# Patient Record
Sex: Male | Born: 1955 | Race: White | Hispanic: No | Marital: Married | Smoking: Former smoker
Health system: Southern US, Community
[De-identification: ages and names within clinical notes are randomized; demographics above are authoritative.]

## PROBLEM LIST (undated history)

## (undated) DIAGNOSIS — E039 Hypothyroidism, unspecified: Secondary | ICD-10-CM

## (undated) DIAGNOSIS — S62509A Fracture of unspecified phalanx of unspecified thumb, initial encounter for closed fracture: Secondary | ICD-10-CM

## (undated) DIAGNOSIS — I10 Essential (primary) hypertension: Secondary | ICD-10-CM

## (undated) DIAGNOSIS — M1990 Unspecified osteoarthritis, unspecified site: Secondary | ICD-10-CM

## (undated) DIAGNOSIS — K219 Gastro-esophageal reflux disease without esophagitis: Secondary | ICD-10-CM

## (undated) HISTORY — DX: Hypothyroidism, unspecified: E03.9

## (undated) HISTORY — DX: Gastro-esophageal reflux disease without esophagitis: K21.9

## (undated) HISTORY — DX: Essential (primary) hypertension: I10

## (undated) HISTORY — DX: Fracture of unspecified phalanx of unspecified thumb, initial encounter for closed fracture: S62.509A

---

## 2003-01-15 ENCOUNTER — Encounter: Payer: Self-pay | Admitting: Family Medicine

## 2004-10-26 ENCOUNTER — Ambulatory Visit: Payer: Self-pay | Admitting: Family Medicine

## 2004-11-03 ENCOUNTER — Ambulatory Visit: Payer: Self-pay | Admitting: Family Medicine

## 2005-11-19 ENCOUNTER — Ambulatory Visit: Payer: Self-pay | Admitting: Family Medicine

## 2005-11-26 ENCOUNTER — Ambulatory Visit: Payer: Self-pay | Admitting: Family Medicine

## 2007-04-08 DIAGNOSIS — E039 Hypothyroidism, unspecified: Secondary | ICD-10-CM | POA: Insufficient documentation

## 2007-04-08 DIAGNOSIS — J309 Allergic rhinitis, unspecified: Secondary | ICD-10-CM | POA: Insufficient documentation

## 2007-04-08 DIAGNOSIS — K219 Gastro-esophageal reflux disease without esophagitis: Secondary | ICD-10-CM

## 2007-08-18 ENCOUNTER — Telehealth: Payer: Self-pay | Admitting: Family Medicine

## 2007-08-22 ENCOUNTER — Ambulatory Visit: Payer: Self-pay | Admitting: Family Medicine

## 2007-08-22 DIAGNOSIS — I10 Essential (primary) hypertension: Secondary | ICD-10-CM | POA: Insufficient documentation

## 2007-08-26 LAB — CONVERTED CEMR LAB
ALT: 24 units/L (ref 0–53)
Albumin: 4 g/dL (ref 3.5–5.2)
Alkaline Phosphatase: 77 units/L (ref 39–117)
BUN: 11 mg/dL (ref 6–23)
Basophils Absolute: 0 10*3/uL (ref 0.0–0.1)
Basophils Relative: 0.4 % (ref 0.0–1.0)
CO2: 32 meq/L (ref 19–32)
Calcium: 9.9 mg/dL (ref 8.4–10.5)
Eosinophils Absolute: 0.3 10*3/uL (ref 0.0–0.6)
GFR calc Af Amer: 102 mL/min
GFR calc non Af Amer: 84 mL/min
Lymphocytes Relative: 40.4 % (ref 12.0–46.0)
MCHC: 33.8 g/dL (ref 30.0–36.0)
Monocytes Relative: 9.5 % (ref 3.0–11.0)
Neutro Abs: 3.1 10*3/uL (ref 1.4–7.7)
Platelets: 278 10*3/uL (ref 150–400)

## 2007-10-02 ENCOUNTER — Ambulatory Visit: Payer: Self-pay | Admitting: Family Medicine

## 2007-10-15 ENCOUNTER — Telehealth: Payer: Self-pay | Admitting: Family Medicine

## 2007-10-27 ENCOUNTER — Telehealth: Payer: Self-pay | Admitting: Family Medicine

## 2008-07-21 ENCOUNTER — Ambulatory Visit: Payer: Self-pay | Admitting: Family Medicine

## 2008-07-21 DIAGNOSIS — M25569 Pain in unspecified knee: Secondary | ICD-10-CM | POA: Insufficient documentation

## 2008-07-30 ENCOUNTER — Ambulatory Visit: Payer: Self-pay | Admitting: Internal Medicine

## 2008-08-13 ENCOUNTER — Ambulatory Visit: Payer: Self-pay | Admitting: Internal Medicine

## 2008-08-13 HISTORY — PX: COLONOSCOPY: SHX174

## 2008-08-13 LAB — HM COLONOSCOPY

## 2008-09-27 ENCOUNTER — Ambulatory Visit: Payer: Self-pay | Admitting: Family Medicine

## 2008-09-27 DIAGNOSIS — S8410XA Injury of peroneal nerve at lower leg level, unspecified leg, initial encounter: Secondary | ICD-10-CM

## 2008-11-01 ENCOUNTER — Telehealth: Payer: Self-pay | Admitting: Family Medicine

## 2008-11-22 ENCOUNTER — Ambulatory Visit: Payer: Self-pay | Admitting: Family Medicine

## 2008-11-22 LAB — CONVERTED CEMR LAB
Nitrite: NEGATIVE
Urobilinogen, UA: 1

## 2008-11-29 ENCOUNTER — Telehealth: Payer: Self-pay | Admitting: Family Medicine

## 2008-11-30 ENCOUNTER — Ambulatory Visit: Payer: Self-pay | Admitting: Family Medicine

## 2008-11-30 LAB — CONVERTED CEMR LAB
Basophils Relative: 0.1 % (ref 0.0–3.0)
CO2: 31 meq/L (ref 19–32)
Calcium: 9.4 mg/dL (ref 8.4–10.5)
Chloride: 107 meq/L (ref 96–112)
Creatinine, Ser: 1.1 mg/dL (ref 0.4–1.5)
Direct LDL: 155.4 mg/dL
Eosinophils Absolute: 0.2 10*3/uL (ref 0.0–0.7)
Eosinophils Relative: 4.5 % (ref 0.0–5.0)
Hemoglobin: 16 g/dL (ref 13.0–17.0)
Lymphocytes Relative: 33.7 % (ref 12.0–46.0)
MCHC: 34.5 g/dL (ref 30.0–36.0)
MCV: 97.9 fL (ref 78.0–100.0)
Neutro Abs: 2.5 10*3/uL (ref 1.4–7.7)
Neutrophils Relative %: 50.6 % (ref 43.0–77.0)
PSA: 0.34 ng/mL (ref 0.10–4.00)
RBC: 4.74 M/uL (ref 4.22–5.81)
Sodium: 143 meq/L (ref 135–145)
TSH: 2.81 microintl units/mL (ref 0.35–5.50)
Total Bilirubin: 1.1 mg/dL (ref 0.3–1.2)
Total CHOL/HDL Ratio: 4
VLDL: 17 mg/dL (ref 0.0–40.0)
WBC: 5 10*3/uL (ref 4.5–10.5)

## 2009-08-18 ENCOUNTER — Ambulatory Visit: Payer: Self-pay | Admitting: Family Medicine

## 2009-08-18 DIAGNOSIS — M67919 Unspecified disorder of synovium and tendon, unspecified shoulder: Secondary | ICD-10-CM | POA: Insufficient documentation

## 2009-08-18 DIAGNOSIS — M719 Bursopathy, unspecified: Secondary | ICD-10-CM

## 2009-10-27 ENCOUNTER — Telehealth: Payer: Self-pay | Admitting: Family Medicine

## 2010-08-13 LAB — CONVERTED CEMR LAB
Cholesterol: 235 mg/dL (ref 0–200)
HDL: 51.7 mg/dL (ref >39.0)
PSA: 0.32 ng/mL (ref 0.10–4.00)
Total CHOL/HDL Ratio: 4.5
Triglycerides: 90 mg/dL (ref 0–149)
VLDL: 18 mg/dL (ref 0–40)

## 2010-08-15 NOTE — Assessment & Plan Note (Signed)
Summary: left shoulder pain x 2 weeks//ccm   Vital Signs:  Patient profile:   55 year old male Weight:      242 pounds BMI:     30.77 Temp:     97.7 degrees F oral Pulse rate:   86 / minute BP sitting:   114 / 76  (left arm) Cuff size:   large  Vitals Entered By: Alfred Levins, CMA (August 18, 2009 8:55 AM) CC: lt shoulder pain x2 wks   History of Present Illness: Here for 3 weeks of mild sharp pains in the anterior left shoulder. No trauma, but he has been doing a lot around the house lately like cleaning gutters and climbing ladders. No pain down the arm.  Current Medications (verified): 1)  Levoxyl 25 Mcg  Tabs (Levothyroxine Sodium) .... Once Daily 2)  Atacand Hct 32-12.5 Mg  Tabs (Candesartan Cilexetil-Hctz) .... Once Daily 3)  Omeprazole 10 Mg  Cpdr (Omeprazole) .... One By Mouth Bid  Allergies (verified): No Known Drug Allergies  Past History:  Past Medical History: Reviewed history from 10/02/2007 and no changes required. Allergic rhinitis GERD Hypothyroidism Hypertension hematuria, benign (had negative workup per Dr. Ernestine Conrad in [facility]) thumb fracture  Past Surgical History: Reviewed history from 11/30/2008 and no changes required. colonoscopy 08-13-08 per Dr. Juanda Chance, repeat 10 yrs Denies surgical history  Review of Systems  The patient denies anorexia, fever, weight loss, weight gain, vision loss, decreased hearing, hoarseness, chest pain, syncope, dyspnea on exertion, peripheral edema, prolonged cough, headaches, hemoptysis, abdominal pain, melena, hematochezia, severe indigestion/heartburn, hematuria, incontinence, genital sores, muscle weakness, suspicious skin lesions, transient blindness, difficulty walking, depression, unusual weight change, abnormal bleeding, enlarged lymph nodes, angioedema, breast masses, and testicular masses.    Physical Exam  General:  Well-developed,well-nourished,in no acute distress; alert,appropriate and cooperative  throughout examination Extremities:  tender in the anterior left shoulder in the subacromial area. Full ROM actively and passively. No crepitus.   Impression & Recommendations:  Problem # 1:  SUBACROMIAL BURSITIS (ICD-726.10)  Complete Medication List: 1)  Levoxyl 25 Mcg Tabs (Levothyroxine sodium) .... Once daily 2)  Atacand Hct 32-12.5 Mg Tabs (Candesartan cilexetil-hctz) .... Once daily 3)  Omeprazole 10 Mg Cpdr (Omeprazole) .... One by mouth bid  Patient Instructions: 1)  Rest, heat, and take 2 Aleeves two times a day for a week or so.  2)  Please schedule a follow-up appointment as needed .

## 2010-08-15 NOTE — Progress Notes (Signed)
Summary: Rx's for mail order  Phone Note Refill Request   Refills Requested: Medication #1:  LEVOXYL 25 MCG  TABS once daily   Dosage confirmed as above?Dosage Confirmed   Supply Requested: 3 months   Notes: #90  Medication #2:  OMEPRAZOLE 10 MG  CPDR one by mouth bid.   Dosage confirmed as above?Dosage Confirmed   Supply Requested: 3 months   Notes: #180  Method Requested: Fax to Local Pharmacy Initial call taken by: Raechel Ache, RN,  October 27, 2009 8:48 AM Caller: [facility] Call For: Dr Clent Ridges  Summary of Call: Need new Rx's  faxed to [facility] @ [phone].  Follow-up for Phone Call        done Follow-up by: Nelwyn Salisbury MD,  October 27, 2009 9:11 AM  Additional Follow-up for Phone Call Additional follow up Details #1::        Rx faxed to pharmacy Additional Follow-up by: Raechel Ache, RN,  October 27, 2009 9:50 AM    New/Updated Medications: OMEPRAZOLE 10 MG  CPDR (OMEPRAZOLE) one by mouth bid Prescriptions: OMEPRAZOLE 10 MG  CPDR (OMEPRAZOLE) one by mouth bid  #180 x 3   Entered and Authorized by:   Nelwyn Salisbury MD   Signed by:   Nelwyn Salisbury MD on 10/27/2009   Method used:   Print then Give to Patient   RxID:   [id number] LEVOXYL 25 MCG  TABS (LEVOTHYROXINE SODIUM) once daily  #90 x 3   Entered and Authorized by:   Nelwyn Salisbury MD   Signed by:   Nelwyn Salisbury MD on 10/27/2009   Method used:   Print then Give to Patient   RxID:   [id number]

## 2010-10-24 ENCOUNTER — Other Ambulatory Visit: Payer: Self-pay | Admitting: *Deleted

## 2010-10-24 MED ORDER — LEVOTHYROXINE SODIUM 25 MCG PO TABS: 25.0000 ug | ORAL_TABLET | Freq: Every day | ORAL | Status: DC

## 2010-10-24 MED ORDER — OMEPRAZOLE 10 MG PO CPDR: 10.0000 mg | DELAYED_RELEASE_CAPSULE | Freq: Two times a day (BID) | ORAL | Status: DC

## 2010-10-24 NOTE — Telephone Encounter (Signed)
rx sent to [facility]

## 2010-10-24 NOTE — Telephone Encounter (Signed)
[facility],  Levothyroxine and Omeprazole refills per pt request.

## 2010-11-22 ENCOUNTER — Telehealth: Payer: Self-pay | Admitting: *Deleted

## 2010-11-22 NOTE — Telephone Encounter (Signed)
Pt left message on voice mail that he is having leg pain.  Called him back and left message to call me back.

## 2010-11-22 NOTE — Telephone Encounter (Signed)
Appt scheduled for pt to see Dr. Clent Ridges with leg pain.

## 2010-11-23 ENCOUNTER — Encounter: Payer: Self-pay | Admitting: Family Medicine

## 2010-11-24 ENCOUNTER — Ambulatory Visit (INDEPENDENT_AMBULATORY_CARE_PROVIDER_SITE_OTHER): Payer: BC Managed Care – PPO | Admitting: Family Medicine

## 2010-11-24 ENCOUNTER — Encounter: Payer: Self-pay | Admitting: Family Medicine

## 2010-11-24 VITALS — BP 118/62 | HR 77 | Temp 98.1°F | Wt 249.0 lb

## 2010-11-24 DIAGNOSIS — IMO0002 Reserved for concepts with insufficient information to code with codable children: Secondary | ICD-10-CM

## 2010-11-24 DIAGNOSIS — S76019A Strain of muscle, fascia and tendon of unspecified hip, initial encounter: Secondary | ICD-10-CM

## 2010-11-24 NOTE — Progress Notes (Signed)
  Subjective:    Patient ID: Timothy Wise, male    DOB: February 16, 1956, 55 y.o.   MRN: [id number]  HPI Here for 6 days of a sharp pain in the left groin which is worst when he first stands up from a sitting position or gets out of bed. Then after he walks around a few minutes this loosens up and the pain improves. He thinks this may have come from a fall he took last week while out walking his dog, when he tripped over something on the ground.    Review of Systems  Constitutional: Negative.   Musculoskeletal: Positive for arthralgias.       Objective:   Physical Exam  Constitutional:       limping  Musculoskeletal:       Tender along the insertion of the right hip adductors with no swelling and full ROM           Assessment & Plan:  Rest, ice, Aleve prn. I advised him that this may take 2-3 months to heal. Recheck prn

## 2010-12-01 ENCOUNTER — Other Ambulatory Visit (INDEPENDENT_AMBULATORY_CARE_PROVIDER_SITE_OTHER): Payer: BC Managed Care – PPO | Admitting: Family Medicine

## 2010-12-01 DIAGNOSIS — Z0000 Encounter for general adult medical examination without abnormal findings: Secondary | ICD-10-CM

## 2010-12-01 DIAGNOSIS — Z13220 Encounter for screening for lipoid disorders: Secondary | ICD-10-CM

## 2010-12-01 LAB — POCT URINALYSIS DIPSTICK
Bilirubin, UA: NEGATIVE
Glucose, UA: NEGATIVE
Leukocytes, UA: NEGATIVE
Nitrite, UA: NEGATIVE

## 2010-12-01 LAB — CBC WITH DIFFERENTIAL/PLATELET
Basophils Relative: 0.5 % (ref 0.0–3.0)
Eosinophils Relative: 4.6 % (ref 0.0–5.0)
HCT: 45 % (ref 39.0–52.0)
Hemoglobin: 15.6 g/dL (ref 13.0–17.0)
Lymphs Abs: 2.1 10*3/uL (ref 0.7–4.0)
Monocytes Relative: 7.9 % (ref 3.0–12.0)
Platelets: 285 10*3/uL (ref 150.0–400.0)
RBC: 4.64 Mil/uL (ref 4.22–5.81)
WBC: 6.2 10*3/uL (ref 4.5–10.5)

## 2010-12-01 LAB — BASIC METABOLIC PANEL
BUN: 16 mg/dL (ref 6–23)
GFR: 87.98 mL/min (ref >60.00)
Potassium: 4.5 mEq/L (ref 3.5–5.1)
Sodium: 139 mEq/L (ref 135–145)

## 2010-12-01 LAB — LIPID PANEL
Cholesterol: 214 mg/dL — ABNORMAL HIGH (ref 0–200)
Total CHOL/HDL Ratio: 4

## 2010-12-01 LAB — HEPATIC FUNCTION PANEL
ALT: 19 U/L (ref 0–53)
AST: 20 U/L (ref 0–37)
Total Bilirubin: 0.5 mg/dL (ref 0.3–1.2)

## 2010-12-01 LAB — LDL CHOLESTEROL, DIRECT: Direct LDL: 154.7 mg/dL

## 2010-12-08 ENCOUNTER — Ambulatory Visit (INDEPENDENT_AMBULATORY_CARE_PROVIDER_SITE_OTHER): Payer: BC Managed Care – PPO | Admitting: Family Medicine

## 2010-12-08 ENCOUNTER — Encounter: Payer: Self-pay | Admitting: Family Medicine

## 2010-12-08 VITALS — BP 142/88 | HR 77 | Temp 97.8°F | Resp 16 | Ht 74.25 in | Wt 245.0 lb

## 2010-12-08 DIAGNOSIS — Z136 Encounter for screening for cardiovascular disorders: Secondary | ICD-10-CM

## 2010-12-08 DIAGNOSIS — Z0000 Encounter for general adult medical examination without abnormal findings: Secondary | ICD-10-CM

## 2010-12-08 MED ORDER — LEVOTHYROXINE SODIUM 25 MCG PO TABS: 25.0000 ug | ORAL_TABLET | Freq: Every day | ORAL | Status: DC

## 2010-12-08 MED ORDER — LOSARTAN POTASSIUM-HCTZ 100-12.5 MG PO TABS: 1.0000 | ORAL_TABLET | Freq: Every day | ORAL | Status: DC

## 2010-12-08 NOTE — Progress Notes (Signed)
  Subjective:    Patient ID: Timothy Wise, male    DOB: [birth year], 55 y.o.   MRN: [id number]  HPI 55 yr old male for a cpx. He feels fine and has no complaints.    Review of Systems  Constitutional: Negative.   HENT: Negative.   Eyes: Negative.   Respiratory: Negative.   Cardiovascular: Negative.   Gastrointestinal: Negative.   Genitourinary: Negative.   Musculoskeletal: Negative.   Skin: Negative.   Neurological: Negative.   Hematological: Negative.   Psychiatric/Behavioral: Negative.        Objective:   Physical Exam  Constitutional: He is oriented to person, place, and time. He appears well-developed and well-nourished. No distress.  HENT:  Head: Normocephalic and atraumatic.  Right Ear: External ear normal.  Left Ear: External ear normal.  Nose: Nose normal.  Mouth/Throat: Oropharynx is clear and moist. No oropharyngeal exudate.  Eyes: Conjunctivae and EOM are normal. Pupils are equal, round, and reactive to light. Right eye exhibits no discharge. Left eye exhibits no discharge. No scleral icterus.  Neck: Neck supple. No JVD present. No tracheal deviation present. No thyromegaly present.  Cardiovascular: Normal rate, regular rhythm, normal heart sounds and intact distal pulses.  Exam reveals no gallop and no friction rub.   No murmur heard.      EKG normal   Pulmonary/Chest: Effort normal and breath sounds normal. No respiratory distress. He has no wheezes. He has no rales. He exhibits no tenderness.  Abdominal: Soft. Bowel sounds are normal. He exhibits no distension and no mass. There is no tenderness. There is no rebound and no guarding.  Genitourinary: Rectum normal, prostate normal and penis normal. Guaiac negative stool. No penile tenderness.  Musculoskeletal: Normal range of motion. He exhibits no edema and no tenderness.  Lymphadenopathy:    He has no cervical adenopathy.  Neurological: He is alert and oriented to person, place, and time. He has normal reflexes.  No cranial nerve deficit. He exhibits normal muscle tone. Coordination normal.  Skin: Skin is warm and dry. No rash noted. He is not diaphoretic. No erythema. No pallor.  Psychiatric: He has a normal mood and affect. His behavior is normal. Judgment and thought content normal.          Assessment & Plan:  We discussed exercise and diet.

## 2011-03-13 ENCOUNTER — Telehealth: Payer: Self-pay | Admitting: *Deleted

## 2011-03-13 NOTE — Telephone Encounter (Signed)
Change this to Omeprazole 40 mg a day. Call in #90 with 3 rf

## 2011-03-13 NOTE — Telephone Encounter (Signed)
Pt needs new prescription of Omeprazole 10 mg. One po bid sent to [facility] Mail order.  The 10 mg. Did not control her GERD.

## 2011-03-14 MED ORDER — OMEPRAZOLE 40 MG PO CPDR: 40.0000 mg | DELAYED_RELEASE_CAPSULE | Freq: Every day | ORAL | Status: DC

## 2011-03-14 NOTE — Telephone Encounter (Signed)
Script sent e-scribe 

## 2011-04-10 ENCOUNTER — Other Ambulatory Visit: Payer: Self-pay | Admitting: *Deleted

## 2011-04-10 NOTE — Telephone Encounter (Signed)
Pt needs Omeprazole 10 mg. #180 bid sent to [facility]  Pt would like to be called when this has been done, pleae.

## 2011-04-12 NOTE — Telephone Encounter (Signed)
Please take care of this.  

## 2011-04-13 NOTE — Telephone Encounter (Signed)
Left voice message for pt to return my call. I need to clarify this request, see snap shot of last time ordered.

## 2011-04-16 MED ORDER — OMEPRAZOLE 10 MG PO CPDR: 10.0000 mg | DELAYED_RELEASE_CAPSULE | Freq: Two times a day (BID) | ORAL | Status: DC

## 2011-04-16 NOTE — Telephone Encounter (Signed)
Script sent e-scribe and pt aware. Pt said that the script was changed per Dr. Clent Ridges.

## 2011-04-20 ENCOUNTER — Encounter: Payer: Self-pay | Admitting: Family Medicine

## 2011-04-20 ENCOUNTER — Ambulatory Visit (INDEPENDENT_AMBULATORY_CARE_PROVIDER_SITE_OTHER): Payer: BC Managed Care – PPO | Admitting: Family Medicine

## 2011-04-20 VITALS — BP 138/84 | HR 81 | Temp 97.8°F | Wt 251.0 lb

## 2011-04-20 DIAGNOSIS — M25559 Pain in unspecified hip: Secondary | ICD-10-CM

## 2011-04-20 NOTE — Progress Notes (Signed)
  Subjective:    Patient ID: Timothy Wise, male    DOB: 15-Aug-1955, 55 y.o.   MRN: [id number]  HPI Here for several days of sharp pain in the right hip. He was seen here last May for what seemed to be a simple groin strain, and indeed this resolved after a month or so. 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 The anterior right hip now has sharp pains again which are worst when he puts weight on the right leg. Using Aleve.    Review of Systems  Constitutional: Negative.   Musculoskeletal: Positive for arthralgias and gait problem.       Objective:   Physical Exam  Constitutional:       He is in pain when walking and has a slight limp  Musculoskeletal:       The right groin and hip are not tender. ROM is full. No swelling or masses          Assessment & Plan:  Right hip pain. Refer to Orthopedics to evaluate

## 2011-05-07 ENCOUNTER — Telehealth: Payer: Self-pay | Admitting: Family Medicine

## 2011-05-07 MED ORDER — OMEPRAZOLE 40 MG PO CPDR: 40.0000 mg | DELAYED_RELEASE_CAPSULE | Freq: Every day | ORAL | Status: DC

## 2011-05-07 NOTE — Telephone Encounter (Signed)
done

## 2011-06-09 ENCOUNTER — Other Ambulatory Visit: Payer: Self-pay | Admitting: Orthopedic Surgery

## 2011-08-07 ENCOUNTER — Other Ambulatory Visit: Payer: Self-pay | Admitting: Orthopedic Surgery

## 2011-08-07 NOTE — H&P (Signed)
Timothy Wise  DOB: 04/10/1956 Married / Language: English / Race: White / Male  Date of Admission:  08/29/2011  Chief Complaint:  Right Hip Pain  History of Present Illness The patient is a 56 year old male who comes in today for a preoperative History and Physical. The patient is scheduled for a right total hip arthroplasty to be performed by Dr. Frank V. Aluisio, MD at [facility] on 08/29/2011. The patient is a 56 year old male who presents with a hip problem. The patient is here today in referral from Dr. Kendall.The patient reports right hip problems including pain symptoms that have been present for 7 month(s). The symptoms began without any known injury. Symptoms reported include hip pain (groin, unable to cross right leg over left) and pain with weightbearing (unable to cross right leg over left) Previous workup for this problem has included hip x-rays and hip MRI. Previous treatment for this problem has included nonsteroidal anti-inflammatory drugs (Celebrex, helps with the pain). Wilma does not recall any specific injury which led to this hip problem. Pain is mainly in the groin radiating towards his knee. He has had increased limitations in function and motion especially over the past few months. His regular x-rays showed some subchondral sclerosis and Dr. 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 His symptoms have concurrently worsened. It is definitely limiting what he can and can not do. He has not had any history of significant Prednisone use. He does have history of alcohol use. He drinks six to seven beers a night and has been doing this for years. He is ready to proceed with surgery. They have been treated conservatively in the past for the above stated problem and despite conservative measures, they continue to have progressive pain and severe functional limitations and dysfunction. They have failed non-operative  management. It is felt that they would benefit from undergoing total joint replacement. Risks and benefits of the procedure have been discussed with the patient and they elect to proceed with surgery. There are no active contraindications to surgery such as ongoing infection or rapidly progressive neurological disease.  Allergies No Known Drug Allergies  Medication History CeleBREX (200MG Capsule, 1 (one) Oral daily, as needed, Taken starting 08/07/2011) Active. Omeprazole (40MG Capsule DR, Oral daily) Active. Losartan Potassium-HCTZ (100-12.5MG Tablet, Oral daily) Active. Levothyroxine Sodium (25MCG Tablet, Oral daily) Active.  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 Deceased. Age 55 Mother. Deceased, Cerebrovascular Accident, Alzheimer's disease. Ago 70's Brother 1. Living. Ag 51 Sister 1. Living. Age 48  Social History Most recent primary occupation. Warehouse Manager Number of flights of stairs before winded. 2-3 Marital status. married Living situation. live with spouse Illicit drug use. no Pain Contract. no Previously in rehab. no Tobacco / smoke exposure. no Exercise. Exercises rarely; does running / walking Current work status. working full time Alcohol use. current drinker; drinks beer; 15 or more per week Children. 0 Post-Surgical Plans. Plan is to go home.  Review of Systems General:Not Present- Chills, Fever, Night Sweats, Fatigue, Weight Gain, Weight Loss and Memory Loss. Skin:Not Present- Hives, Itching, Rash, Eczema and Lesions. HEENT:Not Present- Tinnitus, Headache, Double Vision, Visual Loss, Hearing Loss and Dentures. Respiratory:Not Present- Shortness of breath with exertion, Shortness of breath at rest,  Allergies, Coughing up   blood and Chronic Cough. Cardiovascular:Not Present- Chest Pain, Racing/skipping heartbeats, Difficulty Breathing Lying Down, Murmur, Swelling and Palpitations. Gastrointestinal:Not Present- Bloody Stool, Heartburn, Abdominal Pain, Vomiting, Nausea, Constipation, Diarrhea, Difficulty Swallowing, Jaundice and Loss of appetitie. Male Genitourinary:Not Present- Urinary frequency, Blood in Urine, Weak urinary stream, Discharge, Flank Pain, Incontinence, Painful Urination, Urgency, Urinary Retention and Urinating at Night. Musculoskeletal:Present- Joint Pain and Morning Stiffness. Not Present- Muscle Weakness, Muscle Pain, Joint Swelling, Back Pain and Spasms. Neurological:Not Present- Tremor, Dizziness, Blackout spells, Paralysis, Difficulty with balance and Weakness. Psychiatric:Not Present- Insomnia.  Vitals Weight: 235 lb Height: 74 in Body Surface Area: 2.36 m Body Mass Index: 30.17 kg/m Pulse: 64 (Regular) Resp.: 14 (Unlabored) BP: 142/84 (Sitting, Right Arm, Standard)  Physical Exam The physical exam findings are as follows: Patient is a 56 year old male with continued hip pain.  General Mental Status - Alert, cooperative and good historian. General Appearance- pleasant. Not in acute distress. Orientation- Oriented X3. Build & Nutrition- Well nourished and Well developed.  Head and Neck Head- normocephalic, atraumatic . Neck Global Assessment- supple. no bruit auscultated on the right and no bruit auscultated on the left.  Eye Pupil- Bilateral- Regular and Round. Note: Wears glasses. Motion- Bilateral- EOMI.  Chest and Lung Exam Auscultation: Breath sounds:- clear at anterior chest wall and - clear at posterior chest wall. Adventitious sounds:- No Adventitious sounds.  Cardiovascular Auscultation:Rhythm- Regular rate and rhythm. Heart Sounds- S1 WNL and S2 WNL. Murmurs & Other Heart Sounds: Murmur 1:Location-  Aortic Area. Timing- Mid-systolic. Grade- II/VI. Character- Low pitched.  Abdomen Palpation/Percussion:Tenderness- Abdomen is non-tender to palpation. Rigidity (guarding)- Abdomen is soft. Auscultation:Auscultation of the abdomen reveals - Bowel sounds normal.  Male Genitourinary Not done, not pertinent to present illness  Musculoskeletal Well developed male, alert and oriented in no apparent distress. Left hip flexion about 110, rotation in 30, out 40, abduction 40 without discomfort. Right hip flexion 95, no internal rotation, about 20 external rotation, 20 to 30 abduction. Pulses, sensation and motor are intact both lower extremities. Knee exam is normal. Antalgic gait pattern is noted.  RADIOGRAPHS: Plain radiographs from earlier in the year and it showed that he did not have any bone on bone change in the hip. We also reviewed the MRI. He has an area of high grade osteonecrosis involving close to half the femoral head with some collapse of that segment. He also had some osteonecrosis on the left but no where near as bad as the right.  Impression/Plan Avascular necrosis of femur head, right   Patient is for a Right Total Hip Replacement by Dr. Aluisio.  Plan is to go home after surgery.  PCP - Dr. Stephen Fry  Drew Perkins, PA-C 

## 2011-08-22 ENCOUNTER — Ambulatory Visit (HOSPITAL_COMMUNITY)
Admission: RE | Admit: 2011-08-22 | Discharge: 2011-08-22 | Disposition: A | Discharge status: Home / Self Care | Payer: BC Managed Care – PPO | Source: Ambulatory Visit | Attending: Orthopedic Surgery | Admitting: Orthopedic Surgery

## 2011-08-22 ENCOUNTER — Encounter (HOSPITAL_COMMUNITY)
Admission: RE | Admit: 2011-08-22 | Discharge: 2011-08-22 | Disposition: A | Discharge status: Home / Self Care | Payer: BC Managed Care – PPO | Source: Ambulatory Visit | Attending: Orthopedic Surgery | Admitting: Orthopedic Surgery

## 2011-08-22 ENCOUNTER — Encounter (HOSPITAL_COMMUNITY): Payer: Self-pay | Admitting: Pharmacy Technician

## 2011-08-22 ENCOUNTER — Encounter (HOSPITAL_COMMUNITY): Payer: Self-pay

## 2011-08-22 DIAGNOSIS — Z01812 Encounter for preprocedural laboratory examination: Secondary | ICD-10-CM | POA: Insufficient documentation

## 2011-08-22 DIAGNOSIS — Z01818 Encounter for other preprocedural examination: Secondary | ICD-10-CM | POA: Insufficient documentation

## 2011-08-22 DIAGNOSIS — M1990 Unspecified osteoarthritis, unspecified site: Secondary | ICD-10-CM

## 2011-08-22 HISTORY — DX: Unspecified osteoarthritis, unspecified site: M19.90

## 2011-08-22 LAB — URINALYSIS, ROUTINE W REFLEX MICROSCOPIC
Bilirubin Urine: NEGATIVE
Glucose, UA: NEGATIVE mg/dL
Hgb urine dipstick: NEGATIVE
Ketones, ur: NEGATIVE mg/dL
Leukocytes, UA: NEGATIVE
Nitrite: NEGATIVE
Protein, ur: NEGATIVE mg/dL
Specific Gravity, Urine: 1.018 (ref 1.005–1.030)
Urobilinogen, UA: 0.2 mg/dL (ref 0.0–1.0)
pH: 7.5 (ref 5.0–8.0)

## 2011-08-22 LAB — COMPREHENSIVE METABOLIC PANEL
ALT: 48 U/L (ref 0–53)
AST: 27 U/L (ref 0–37)
Albumin: 3.7 g/dL (ref 3.5–5.2)
Alkaline Phosphatase: 114 U/L (ref 39–117)
Chloride: 100 mEq/L (ref 96–112)
Potassium: 4.1 mEq/L (ref 3.5–5.1)
Sodium: 135 mEq/L (ref 135–145)
Total Bilirubin: 0.5 mg/dL (ref 0.3–1.2)

## 2011-08-22 LAB — PROTIME-INR
INR: 0.87 (ref 0.00–1.49)
Prothrombin Time: 12 s (ref 11.6–15.2)

## 2011-08-22 LAB — CBC
Platelets: 247 10*3/uL (ref 150–400)
RBC: 4.69 MIL/uL (ref 4.22–5.81)
RDW: 12.3 % (ref 11.5–15.5)
WBC: 6.8 10*3/uL (ref 4.0–10.5)

## 2011-08-22 LAB — APTT: aPTT: 27 seconds (ref 24–37)

## 2011-08-22 LAB — SURGICAL PCR SCREEN: Staphylococcus aureus: POSITIVE — AB

## 2011-08-22 MED ORDER — CHLORHEXIDINE GLUCONATE 4 % EX LIQD: 60.0000 mL | Freq: Once | CUTANEOUS | Status: DC

## 2011-08-22 NOTE — Patient Instructions (Signed)
Timothy Wise  08/22/2011   Your procedure is scheduled on: 08-29-11  Report to [facility] at    10:30    AM.  Call this number if you have problems the morning of surgery: [phone]   Remember:   Do not eat food:After Midnight.  May have clear liquids:up to 6 Hours before arrival. Nothing after : 06:00AM.  Clear liquids include soda, tea, black coffee, apple or grape juice, broth.  Take these medicines the morning of surgery with A SIP OF WATER: Omeprazole, Synthroid   Do not wear jewelry, make-up or nail polish.  Do not wear lotions, powders, or perfumes. You may wear deodorant.  Do not shave 48 hours prior to surgery.(may shave neck and face only)  Do not bring valuables to the hospital.  Contacts, dentures or bridgework may not be worn into surgery.  Leave suitcase in the car. After surgery it may be brought to your room.  For patients admitted to the hospital, checkout time is 11:00 AM the day of discharge.   Patients discharged the day of surgery will not be allowed to drive home.  Name and phone number of your driver: Timothy Wise, spouse [phone]  Special Instructions: CHG Shower Use Special Wash: 1/2 bottle night before surgery and 1/2 bottle morning of surgery.   Please read over the following fact sheets that you were given: MRSA Information, Instruction Incentive Spirometry, Blood transfusion fact sheet.

## 2011-08-22 NOTE — Pre-Procedure Instructions (Signed)
08-22-11 Ekg (5'12), CXR to be done today.

## 2011-08-29 ENCOUNTER — Encounter (HOSPITAL_COMMUNITY): Payer: Self-pay | Admitting: Anesthesiology

## 2011-08-29 ENCOUNTER — Encounter (HOSPITAL_COMMUNITY)
Admission: RE | Disposition: A | Discharge status: Home Health | Payer: Self-pay | Source: Ambulatory Visit | Attending: Orthopedic Surgery

## 2011-08-29 ENCOUNTER — Inpatient Hospital Stay (HOSPITAL_COMMUNITY): Payer: BC Managed Care – PPO

## 2011-08-29 ENCOUNTER — Inpatient Hospital Stay (HOSPITAL_COMMUNITY)
Admission: RE | Admit: 2011-08-29 | Discharge: 2011-08-31 | DRG: 818 | Disposition: A | Discharge status: Home Health | Payer: BC Managed Care – PPO | Source: Ambulatory Visit | Attending: Orthopedic Surgery | Admitting: Orthopedic Surgery

## 2011-08-29 ENCOUNTER — Inpatient Hospital Stay (HOSPITAL_COMMUNITY): Payer: BC Managed Care – PPO | Admitting: Anesthesiology

## 2011-08-29 DIAGNOSIS — E871 Hypo-osmolality and hyponatremia: Secondary | ICD-10-CM | POA: Diagnosis not present

## 2011-08-29 DIAGNOSIS — M8970 Major osseous defect, unspecified site: Secondary | ICD-10-CM | POA: Diagnosis present

## 2011-08-29 DIAGNOSIS — Z96649 Presence of unspecified artificial hip joint: Secondary | ICD-10-CM

## 2011-08-29 DIAGNOSIS — M87059 Idiopathic aseptic necrosis of unspecified femur: Principal | ICD-10-CM | POA: Diagnosis present

## 2011-08-29 DIAGNOSIS — I10 Essential (primary) hypertension: Secondary | ICD-10-CM | POA: Diagnosis present

## 2011-08-29 DIAGNOSIS — E039 Hypothyroidism, unspecified: Secondary | ICD-10-CM | POA: Diagnosis present

## 2011-08-29 HISTORY — PX: TOTAL HIP ARTHROPLASTY: SHX124

## 2011-08-29 LAB — TYPE AND SCREEN: Antibody Screen: NEGATIVE

## 2011-08-29 SURGERY — ARTHROPLASTY, HIP, TOTAL,POSTERIOR APPROACH
Anesthesia: General | Site: Hip | Laterality: Right | Wound class: Clean

## 2011-08-29 MED ORDER — SODIUM CHLORIDE 0.9 % IV SOLN
INTRAVENOUS | Status: DC
  Administered 2011-08-29 – 2011-08-30 (×3): via INTRAVENOUS

## 2011-08-29 MED ORDER — POLYETHYLENE GLYCOL 3350 17 G PO PACK
17.0000 g | PACK | Freq: Every day | ORAL | Status: DC | PRN
  Filled 2011-08-29: qty 1

## 2011-08-29 MED ORDER — 0.9 % SODIUM CHLORIDE (POUR BTL) OPTIME
TOPICAL | Status: DC | PRN
  Administered 2011-08-29: 1000 mL

## 2011-08-29 MED ORDER — LIDOCAINE HCL (CARDIAC) 20 MG/ML IV SOLN
INTRAVENOUS | Status: DC | PRN
  Administered 2011-08-29: 100 mg via INTRAVENOUS

## 2011-08-29 MED ORDER — GLYCOPYRROLATE 0.2 MG/ML IJ SOLN
INTRAMUSCULAR | Status: DC | PRN
  Administered 2011-08-29: .8 mg via INTRAVENOUS

## 2011-08-29 MED ORDER — HYDROMORPHONE HCL PF 1 MG/ML IJ SOLN
INTRAMUSCULAR | Status: DC | PRN
  Administered 2011-08-29 (×2): 1 mg via INTRAVENOUS

## 2011-08-29 MED ORDER — MORPHINE SULFATE 2 MG/ML IJ SOLN
INTRAMUSCULAR | Status: AC
  Administered 2011-08-29: 2 mg via INTRAVENOUS
  Filled 2011-08-29: qty 1

## 2011-08-29 MED ORDER — LACTATED RINGERS IV SOLN
INTRAVENOUS | Status: DC
  Administered 2011-08-29: 1000 mL via INTRAVENOUS

## 2011-08-29 MED ORDER — DOCUSATE SODIUM 100 MG PO CAPS
100.0000 mg | ORAL_CAPSULE | Freq: Two times a day (BID) | ORAL | Status: DC
  Administered 2011-08-29 – 2011-08-31 (×4): 100 mg via ORAL
  Filled 2011-08-29 (×7): qty 1

## 2011-08-29 MED ORDER — HYDROCHLOROTHIAZIDE 12.5 MG PO CAPS
12.5000 mg | ORAL_CAPSULE | Freq: Every day | ORAL | Status: DC
  Administered 2011-08-30 – 2011-08-31 (×2): 12.5 mg via ORAL
  Filled 2011-08-29 (×3): qty 1

## 2011-08-29 MED ORDER — METHOCARBAMOL 500 MG PO TABS
500.0000 mg | ORAL_TABLET | Freq: Four times a day (QID) | ORAL | Status: DC | PRN
  Administered 2011-08-29 – 2011-08-31 (×4): 500 mg via ORAL
  Filled 2011-08-29 (×6): qty 1

## 2011-08-29 MED ORDER — DEXAMETHASONE SODIUM PHOSPHATE 10 MG/ML IJ SOLN: 10.0000 mg | Freq: Once | INTRAMUSCULAR | Status: DC

## 2011-08-29 MED ORDER — LOSARTAN POTASSIUM-HCTZ 100-12.5 MG PO TABS: 1.0000 | ORAL_TABLET | Freq: Every day | ORAL | Status: DC

## 2011-08-29 MED ORDER — FENTANYL CITRATE 0.05 MG/ML IJ SOLN
INTRAMUSCULAR | Status: DC | PRN
  Administered 2011-08-29: 50 ug via INTRAVENOUS
  Administered 2011-08-29 (×2): 100 ug via INTRAVENOUS
  Administered 2011-08-29 (×2): 50 ug via INTRAVENOUS

## 2011-08-29 MED ORDER — ACETAMINOPHEN 650 MG RE SUPP: 650.0000 mg | Freq: Four times a day (QID) | RECTAL | Status: DC | PRN

## 2011-08-29 MED ORDER — ACETAMINOPHEN 10 MG/ML IV SOLN
1000.0000 mg | Freq: Four times a day (QID) | INTRAVENOUS | Status: AC
  Administered 2011-08-29 – 2011-08-30 (×4): 1000 mg via INTRAVENOUS
  Filled 2011-08-29 (×5): qty 100

## 2011-08-29 MED ORDER — NEOSTIGMINE METHYLSULFATE 1 MG/ML IJ SOLN
INTRAMUSCULAR | Status: DC | PRN
  Administered 2011-08-29: 5 mg via INTRAVENOUS

## 2011-08-29 MED ORDER — BISACODYL 10 MG RE SUPP: 10.0000 mg | Freq: Every day | RECTAL | Status: DC | PRN

## 2011-08-29 MED ORDER — OXYCODONE HCL 5 MG PO TABS
5.0000 mg | ORAL_TABLET | ORAL | Status: DC | PRN
  Administered 2011-08-29: 5 mg via ORAL
  Administered 2011-08-30 – 2011-08-31 (×8): 10 mg via ORAL
  Filled 2011-08-29: qty 1
  Filled 2011-08-29 (×4): qty 2
  Filled 2011-08-29: qty 1
  Filled 2011-08-29: qty 2
  Filled 2011-08-29: qty 1
  Filled 2011-08-29 (×5): qty 2

## 2011-08-29 MED ORDER — TEMAZEPAM 15 MG PO CAPS: 15.0000 mg | ORAL_CAPSULE | Freq: Every evening | ORAL | Status: DC | PRN

## 2011-08-29 MED ORDER — CEFAZOLIN SODIUM-DEXTROSE 2-3 GM-% IV SOLR
2.0000 g | Freq: Once | INTRAVENOUS | Status: AC
  Administered 2011-08-29: 2 g via INTRAVENOUS

## 2011-08-29 MED ORDER — ACETAMINOPHEN 10 MG/ML IV SOLN
1000.0000 mg | Freq: Once | INTRAVENOUS | Status: AC
  Administered 2011-08-29: 1000 mg via INTRAVENOUS

## 2011-08-29 MED ORDER — PROMETHAZINE HCL 25 MG/ML IJ SOLN: 6.2500 mg | INTRAMUSCULAR | Status: DC | PRN

## 2011-08-29 MED ORDER — LACTATED RINGERS IV SOLN: INTRAVENOUS | Status: DC

## 2011-08-29 MED ORDER — METHOCARBAMOL 100 MG/ML IJ SOLN
500.0000 mg | Freq: Four times a day (QID) | INTRAVENOUS | Status: DC | PRN
  Administered 2011-08-29: 500 mg via INTRAVENOUS
  Filled 2011-08-29: qty 5

## 2011-08-29 MED ORDER — METOCLOPRAMIDE HCL 10 MG PO TABS: 5.0000 mg | ORAL_TABLET | Freq: Three times a day (TID) | ORAL | Status: DC | PRN

## 2011-08-29 MED ORDER — MENTHOL 3 MG MT LOZG: 1.0000 | LOZENGE | OROMUCOSAL | Status: DC | PRN

## 2011-08-29 MED ORDER — FLEET ENEMA 7-19 GM/118ML RE ENEM: 1.0000 | ENEMA | Freq: Once | RECTAL | Status: AC | PRN

## 2011-08-29 MED ORDER — DIPHENHYDRAMINE HCL 12.5 MG/5ML PO ELIX: 12.5000 mg | ORAL_SOLUTION | ORAL | Status: DC | PRN

## 2011-08-29 MED ORDER — SODIUM CHLORIDE 0.9 % IV SOLN: INTRAVENOUS | Status: DC

## 2011-08-29 MED ORDER — RIVAROXABAN 10 MG PO TABS
10.0000 mg | ORAL_TABLET | Freq: Every day | ORAL | Status: DC
  Administered 2011-08-30 – 2011-08-31 (×2): 10 mg via ORAL
  Filled 2011-08-29 (×3): qty 1

## 2011-08-29 MED ORDER — ROCURONIUM BROMIDE 100 MG/10ML IV SOLN
INTRAVENOUS | Status: DC | PRN
  Administered 2011-08-29: 40 mg via INTRAVENOUS

## 2011-08-29 MED ORDER — MIDAZOLAM HCL 5 MG/5ML IJ SOLN
INTRAMUSCULAR | Status: DC | PRN
  Administered 2011-08-29: 2 mg via INTRAVENOUS

## 2011-08-29 MED ORDER — PHENOL 1.4 % MT LIQD: 1.0000 | OROMUCOSAL | Status: DC | PRN

## 2011-08-29 MED ORDER — CEFAZOLIN SODIUM 1-5 GM-% IV SOLN
1.0000 g | Freq: Four times a day (QID) | INTRAVENOUS | Status: AC
  Administered 2011-08-29 – 2011-08-30 (×3): 1 g via INTRAVENOUS
  Filled 2011-08-29 (×3): qty 50

## 2011-08-29 MED ORDER — PROPOFOL 10 MG/ML IV EMUL
INTRAVENOUS | Status: DC | PRN
  Administered 2011-08-29: 200 mg via INTRAVENOUS

## 2011-08-29 MED ORDER — ONDANSETRON HCL 4 MG/2ML IJ SOLN
INTRAMUSCULAR | Status: DC | PRN
  Administered 2011-08-29: 4 mg via INTRAVENOUS

## 2011-08-29 MED ORDER — ONDANSETRON HCL 4 MG PO TABS: 4.0000 mg | ORAL_TABLET | Freq: Four times a day (QID) | ORAL | Status: DC | PRN

## 2011-08-29 MED ORDER — ONDANSETRON HCL 4 MG/2ML IJ SOLN
4.0000 mg | Freq: Four times a day (QID) | INTRAMUSCULAR | Status: DC | PRN
  Administered 2011-08-29: 4 mg via INTRAVENOUS
  Filled 2011-08-29: qty 2

## 2011-08-29 MED ORDER — PANTOPRAZOLE SODIUM 40 MG PO TBEC
80.0000 mg | DELAYED_RELEASE_TABLET | Freq: Every day | ORAL | Status: DC
  Administered 2011-08-29: 80 mg via ORAL
  Filled 2011-08-29 (×3): qty 2

## 2011-08-29 MED ORDER — ACETAMINOPHEN 325 MG PO TABS: 650.0000 mg | ORAL_TABLET | Freq: Four times a day (QID) | ORAL | Status: DC | PRN

## 2011-08-29 MED ORDER — MORPHINE SULFATE 2 MG/ML IJ SOLN
1.0000 mg | INTRAMUSCULAR | Status: DC | PRN
  Administered 2011-08-29 (×2): 2 mg via INTRAVENOUS
  Filled 2011-08-29: qty 1

## 2011-08-29 MED ORDER — LEVOTHYROXINE SODIUM 25 MCG PO TABS
25.0000 ug | ORAL_TABLET | Freq: Every day | ORAL | Status: DC
  Administered 2011-08-30 – 2011-08-31 (×2): 25 ug via ORAL
  Filled 2011-08-29 (×3): qty 1

## 2011-08-29 MED ORDER — METOCLOPRAMIDE HCL 5 MG/ML IJ SOLN: 5.0000 mg | Freq: Three times a day (TID) | INTRAMUSCULAR | Status: DC | PRN

## 2011-08-29 MED ORDER — LOSARTAN POTASSIUM 50 MG PO TABS
100.0000 mg | ORAL_TABLET | Freq: Every day | ORAL | Status: DC
  Administered 2011-08-30: 100 mg via ORAL
  Filled 2011-08-29 (×3): qty 2

## 2011-08-29 MED ORDER — BUPIVACAINE LIPOSOME 1.3 % IJ SUSP
20.0000 mL | Freq: Once | INTRAMUSCULAR | Status: AC
  Administered 2011-08-29: 20 mL
  Filled 2011-08-29: qty 20

## 2011-08-29 MED ORDER — FENTANYL CITRATE 0.05 MG/ML IJ SOLN
25.0000 ug | INTRAMUSCULAR | Status: DC | PRN
  Administered 2011-08-29 (×4): 25 ug via INTRAVENOUS

## 2011-08-29 SURGICAL SUPPLY — 50 items
BAG SPEC THK2 15X12 ZIP CLS (MISCELLANEOUS) ×1
BAG ZIPLOCK 12X15 (MISCELLANEOUS) ×2 IMPLANT
BIT DRILL 2.8X128 (BIT) ×2 IMPLANT
BLADE EXTENDED COATED 6.5IN (ELECTRODE) ×2 IMPLANT
BLADE SAW SAG 73X25 THK (BLADE) ×1
BLADE SAW SGTL 73X25 THK (BLADE) ×1 IMPLANT
CLOTH BEACON ORANGE TIMEOUT ST (SAFETY) ×2 IMPLANT
DECANTER SPIKE VIAL GLASS SM (MISCELLANEOUS) ×2 IMPLANT
DRAPE INCISE IOBAN 66X45 STRL (DRAPES) ×2 IMPLANT
DRAPE ORTHO SPLIT 77X108 STRL (DRAPES) ×4
DRAPE POUCH INSTRU U-SHP 10X18 (DRAPES) ×2 IMPLANT
DRAPE SURG ORHT 6 SPLT 77X108 (DRAPES) ×2 IMPLANT
DRAPE U-SHAPE 47X51 STRL (DRAPES) ×2 IMPLANT
DRSG ADAPTIC 3X8 NADH LF (GAUZE/BANDAGES/DRESSINGS) ×2 IMPLANT
DRSG MEPILEX BORDER 4X4 (GAUZE/BANDAGES/DRESSINGS) ×2 IMPLANT
DRSG MEPILEX BORDER 4X8 (GAUZE/BANDAGES/DRESSINGS) ×2 IMPLANT
DURAPREP 26ML APPLICATOR (WOUND CARE) ×2 IMPLANT
ELECT REM PT RETURN 9FT ADLT (ELECTROSURGICAL) ×2
ELECTRODE REM PT RTRN 9FT ADLT (ELECTROSURGICAL) ×1 IMPLANT
EVACUATOR 1/8 PVC DRAIN (DRAIN) ×2 IMPLANT
FACESHIELD LNG OPTICON STERILE (SAFETY) ×8 IMPLANT
GLOVE BIO SURGEON STRL SZ7.5 (GLOVE) ×2 IMPLANT
GLOVE BIO SURGEON STRL SZ8 (GLOVE) ×2 IMPLANT
GLOVE BIOGEL PI IND STRL 8 (GLOVE) ×2 IMPLANT
GLOVE BIOGEL PI INDICATOR 8 (GLOVE) ×2
GOWN STRL NON-REIN LRG LVL3 (GOWN DISPOSABLE) ×2 IMPLANT
GOWN STRL REIN XL XLG (GOWN DISPOSABLE) ×2 IMPLANT
IMMOBILIZER KNEE 20 (SOFTGOODS) ×2
IMMOBILIZER KNEE 20 THIGH 36 (SOFTGOODS) IMPLANT
KIT BASIN OR (CUSTOM PROCEDURE TRAY) ×2 IMPLANT
MANIFOLD NEPTUNE II (INSTRUMENTS) ×2 IMPLANT
NDL SAFETY ECLIPSE 18X1.5 (NEEDLE) ×1 IMPLANT
NEEDLE HYPO 18GX1.5 SHARP (NEEDLE) ×2
NS IRRIG 1000ML POUR BTL (IV SOLUTION) ×2 IMPLANT
PACK TOTAL JOINT (CUSTOM PROCEDURE TRAY) ×2 IMPLANT
PASSER SUT SWANSON 36MM LOOP (INSTRUMENTS) ×2 IMPLANT
POSITIONER SURGICAL ARM (MISCELLANEOUS) ×2 IMPLANT
SPONGE GAUZE 4X4 12PLY (GAUZE/BANDAGES/DRESSINGS) ×2 IMPLANT
STRIP CLOSURE SKIN 1/2X4 (GAUZE/BANDAGES/DRESSINGS) ×4 IMPLANT
SUT ETHIBOND NAB CT1 #1 30IN (SUTURE) ×4 IMPLANT
SUT MNCRL AB 4-0 PS2 18 (SUTURE) ×2 IMPLANT
SUT VIC AB 1 CT1 27 (SUTURE) ×6
SUT VIC AB 1 CT1 27XBRD ANTBC (SUTURE) ×3 IMPLANT
SUT VIC AB 2-0 CT1 27 (SUTURE) ×6
SUT VIC AB 2-0 CT1 TAPERPNT 27 (SUTURE) ×3 IMPLANT
SYR 50ML LL SCALE MARK (SYRINGE) ×2 IMPLANT
TOWEL OR 17X26 10 PK STRL BLUE (TOWEL DISPOSABLE) ×4 IMPLANT
TOWEL OR NON WOVEN STRL DISP B (DISPOSABLE) ×2 IMPLANT
TRAY FOLEY CATH 14FRSI W/METER (CATHETERS) ×2 IMPLANT
WATER STERILE IRR 1500ML POUR (IV SOLUTION) ×2 IMPLANT

## 2011-08-29 NOTE — Transfer of Care (Signed)
Immediate Anesthesia Transfer of Care Note  Patient: Timothy Wise  Procedure(s) Performed: Procedure(s) (LRB): TOTAL HIP ARTHROPLASTY (Right)  Patient Location: PACU  Anesthesia Type: General  Level of Consciousness: awake, alert , oriented and patient cooperative  Airway & Oxygen Therapy: Patient Spontanous Breathing and Patient connected to face mask oxygen  Post-op Assessment: Report given to PACU RN and Post -op Vital signs reviewed and stable  Post vital signs: Reviewed and stable  Complications: No apparent anesthesia complications

## 2011-08-29 NOTE — Interval H&P Note (Signed)
History and Physical Interval Note:  08/29/2011 1:06 PM  Timothy Wise  has presented today for surgery, with the diagnosis of avacular necrosis right hip  The various methods of treatment have been discussed with the patient and family. After consideration of risks, benefits and other options for treatment, the patient has consented to  Procedure(s) (LRB): TOTAL HIP ARTHROPLASTY (Right) as a surgical intervention .  The patients' history has been reviewed, patient examined, no change in status, stable for surgery.  I have reviewed the patients' chart and labs.  Questions were answered to the patient's satisfaction.     Loanne Drilling

## 2011-08-29 NOTE — Anesthesia Postprocedure Evaluation (Signed)
Anesthesia Post Note  Patient: Timothy Wise  Procedure(s) Performed: Procedure(s) (LRB): TOTAL HIP ARTHROPLASTY (Right)  Anesthesia type: General  Patient location: PACU  Post pain: Pain level controlled  Post assessment: Post-op Vital signs reviewed  Last Vitals:  Filed Vitals:   08/29/11 1515  BP: 146/76  Pulse:   Temp:   Resp:     Post vital signs: Reviewed  Level of consciousness: sedated  Complications: No apparent anesthesia complications

## 2011-08-29 NOTE — H&P (Signed)
Timothy Wise  DOB: 1955-12-13 Married / Language: English / Race: White Male  Date of Admission:  08/29/2011  Chief Complaint:  Right Hip Pain  History of Present Illness The patient is a 56 year old male who comes in today for a preoperative History and Physical. The patient is scheduled for a right total hip arthroplasty to be performed by Dr. Gus Wise. Aluisio, MD at [facility] on 08/29/2011. The patient is a 56 year old male who presents with a hip problem. The patient is here today in referral from Dr. Penni Wise.The patient reports right hip problems including pain symptoms that have been present for 7 month(s). The symptoms began without any known injury. Symptoms reported include hip pain (groin, unable to cross right leg over left) and pain with weightbearing (unable to cross right leg over left) Previous workup for this problem has included hip x-rays and hip MRI. Previous treatment for this problem has included nonsteroidal anti-inflammatory drugs (Celebrex, helps with the pain).  Timothy Wise does not recall any specific injury which led to this hip problem. Pain is mainly in the groin radiating towards his knee. He has had increased limitations in function and motion especially over the past few months. His regular x-rays showed some subchondral sclerosis and Dr. 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 His symptoms have concurrently worsened. It is definitely limiting what he can and can not do. He has not had any history of significant Prednisone use. He does have history of alcohol use. He drinks six to seven beers a night and has been doing this for years. He is ready to proceed with surgery. They have been treated conservatively in the past for the above stated problem and despite conservative measures, they continue to have progressive pain and severe functional limitations and dysfunction. They have failed  non-operative management. It is felt that they would benefit from undergoing total joint replacement. Risks and benefits of the procedure have been discussed with the patient and they elect to proceed with surgery. There are no active contraindications to surgery such as ongoing infection or rapidly progressive neurological disease.  Allergies No Known Drug Allergies.  Problem List/Past Medical Gastroesophageal Reflux Disease Hypothyroidism Avascular necrosis of femur head, right (733.42) Pain, Hip (719.45) Hypertension Hypercholesterolemia  Family History Father. Deceased. Age 72 Mother. Deceased, Cerebrovascular Accident, Alzheimer's disease. Ago 70's Brother 1. Living. Ag 51 Sister 1. Living. Age 23  Social History Most recent primary occupation. Warehouse Manager Number of flights of stairs before winded. 2-3 Marital status. married Living situation. live with spouse Illicit drug use. no Pain Contract. no Previously in rehab. no Tobacco / smoke exposure. no Exercise. Exercises rarely; does running / walking Current work status. working full time Alcohol use. current drinker; drinks beer; 15 or more per week Children. 0 Post-Surgical Plans. Plan is to go home.  Medication History CeleBREX (200MG  Capsule, 1 (one) Oral daily, as needed, Taken starting 08/07/2011) Active. Omeprazole (40MG  Capsule DR, Oral daily) Active. Losartan Potassium-HCTZ (100-12.5MG  Tablet, Oral daily) Active. Levothyroxine Sodium ( Tablet, Oral daily) Active.  Past Surgical History No pertinent past surgical history  Review of Systems General:Not Present- Chills, Fever, Night Sweats, Fatigue, Weight Gain, Weight Loss and Memory Loss. Skin:Not Present- Hives, Itching, Rash, Eczema and Lesions. HEENT:Not Present- Tinnitus, Headache, Double Vision, Visual Loss, Hearing Loss and Dentures. Respiratory:Not Present- Shortness of breath with exertion, Shortness of breath at  rest, Allergies, Coughing up  blood and Chronic Cough. Cardiovascular:Not Present- Chest Pain, Racing/skipping heartbeats, Difficulty Breathing Lying Down, Murmur, Swelling and Palpitations. Gastrointestinal:Not Present- Bloody Stool, Heartburn, Abdominal Pain, Vomiting, Nausea, Constipation, Diarrhea, Difficulty Swallowing, Jaundice and Loss of appetitie. Male Genitourinary:Not Present- Urinary frequency, Blood in Urine, Weak urinary stream, Discharge, Flank Pain, Incontinence, Painful Urination, Urgency, Urinary Retention and Urinating at Night. Musculoskeletal:Present- Joint Pain and Morning Stiffness. Not Present- Muscle Weakness, Muscle Pain, Joint Swelling, Back Pain and Spasms. Neurological:Not Present- Tremor, Dizziness, Blackout spells, Paralysis, Difficulty with balance and Weakness. Psychiatric:Not Present- Insomnia.  Vitals Weight: 235 lb Height: 74 in Body Surface Area: 2.36 m Body Mass Index: 30.17 kg/m Pulse: 64 (Regular) Resp.: 14 (Unlabored) BP: 142/84 (Sitting, Right Arm, Standard)  Physical Exam The physical exam findings are as follows: Patient is a 55 year old male with continued hip pain.   General Mental Status - Alert, cooperative and good historian. General Appearance- pleasant. Not in acute distress. Orientation- Oriented X3. Build & Nutrition- Well nourished and Well developed.   Head and Neck Head- normocephalic, atraumatic . Neck Global Assessment- supple. no bruit auscultated on the right and no bruit auscultated on the left.   Eye Pupil- Bilateral- Regular and Round. Note: Wears glasses. Motion- Bilateral- EOMI.   Chest and Lung Exam Auscultation: Breath sounds:- clear at anterior chest wall and - clear at posterior chest wall. Adventitious sounds:- No Adventitious sounds.   Cardiovascular Auscultation:Rhythm- Regular rate and rhythm. Heart Sounds- S1 WNL and S2 WNL. Murmurs & Other Heart  Sounds: Murmur 1:Location- Aortic Area. Timing- Mid-systolic. Grade- II/VI. Character- Low pitched.   Abdomen Palpation/Percussion:Tenderness- Abdomen is non-tender to palpation. Rigidity (guarding)- Abdomen is soft. Auscultation:Auscultation of the abdomen reveals - Bowel sounds normal.   Male Genitourinary Not done, not pertinent to present illness  Musculoskeletal Well developed male, alert and oriented in no apparent distress. Left hip flexion about 110, rotation in 30, out 40, abduction 40 without discomfort. Right hip flexion 95, no internal rotation, about 20 external rotation, 20 to 30 abduction. Pulses, sensation and motor are intact both lower extremities. Knee exam is normal. Antalgic gait pattern is noted.  RADIOGRAPHS: Plain radiographs from earlier in the year and it showed that he did not have any bone on bone change in the hip. We also reviewed the MRI. He has an area of high grade osteonecrosis involving close to half the femoral head with some collapse of that segment. He also had some osteonecrosis on the left but no where near as bad as the right.  Assessment & Plan Avascular necrosis of femur head, right  Patient is for a Right Total Hip Replacement by Dr. Lequita Halt.  Plan is to go home after surgery.  PCP - Dr. Gershon Crane  Avel Peace, PA-C

## 2011-08-29 NOTE — Transfer of Care (Signed)
Immediate Anesthesia Transfer of Care Note  Patient: Timothy Wise  Procedure(s) Performed: Procedure(s) (LRB): TOTAL HIP ARTHROPLASTY (Right)  Patient Location: PACU  Anesthesia Type: General  Level of Consciousness: awake, alert , oriented and patient cooperative  Airway & Oxygen Therapy: Patient Spontanous Breathing and Patient connected to face mask oxygen  Post-op Assessment: Report given to PACU RN and Post -op Vital signs reviewed and stable  Post vital signs: Reviewed and stable  Complications: No apparent anesthesia complications 

## 2011-08-29 NOTE — Preoperative (Signed)
Beta Blockers   Reason not to administer Beta Blockers:Not Applicable 

## 2011-08-29 NOTE — Op Note (Signed)
Pre-operative diagnosis- AVN Right hip  Post-operative diagnosis- AVN  Right hip  Procedure-  RightTotal Hip Arthroplasty  Surgeon- Timothy Wise. Timothy Art, MD  Assistant- Timothy Peace, PA-C   Anesthesia  General  EBL- 300   Drain Hemovac   Complication- None  Condition-PACU - hemodynamically stable.   Brief Clinical Note- Timothy Wise is a 56 y.o. 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 Pain occurs with activity and rest including pain at night. He has tried analgesics, protected weight bearing and rest without benefit. Pain is too severe to attempt physical therapy. MRI demonstrates high grade AVN of the femoral head with subtotal head involvement. He presents now for right THA.  Procedure in detail-   The patient is brought into the operating room and placed on the operating table. After successful administration of General  anesthesia, the patient is placed in the  Left lateral decubitus position with the  Right side up and held in place with the hip positioner. The lower extremity is isolated from the perineum with plastic drapes and time-out is performed by the surgical team. The lower extremity is then prepped and draped in the usual sterile fashion. A short posterolateral incision is made with a ten blade through the subcutaneous tissue to the level of the fascia lata which is incised in line with the skin incision. The sciatic nerve is palpated and protected and the short external rotators and capsule are isolated from the femur. The hip is then dislocated and the center of the femoral head is marked. A trial prosthesis is placed such that the trial head corresponds to the center of the patients' native femoral head. The resection level is marked on the femoral neck and the resection is made with an oscillating saw. The femoral head is removed and femoral retractors placed to gain access to the femoral canal.      The canal finder is  passed into the femoral canal and the canal is thoroughly irrigated with sterile saline to remove the fatty contents. Axial reaming is performed to 15.5  mm, proximal reaming to 57F  and the sleeve machined to a XXL. A 57F XXL trial sleeve is placed into the proximal femur.      The femur is then retracted anteriorly to gain acetabular exposure. Acetabular retractors are placed and the labrum and osteophytes are removed, Acetabular reaming is performed to 59  mm and a 60  mm Pinnacle acetabular shell is placed in anatomic position with excellent purchase. Additional dome screws  were not needed. An apex hole eliminator is placed and the permanent 36 mm neutral + 4 Marathon liner is placed into the acetabular shell.      The trial femur is then placed into the femoral canal. The size is 20 x 15  stem with a 36 + 12  neck and a 36 +3 head with the neck version 15 degrees beyond  the patients' native anteversion. The hip is reduced with excellent stability with full extension and full external rotation, 70 degrees flexion with 40 degrees adduction and 90 degrees internal rotation and 90 degrees of flexion with 70 degrees of internal rotation. The operative leg is placed on top of the non-operative leg and the leg lengths are found to be equal. The trials are then removed and the permanent implants of the same size are impacted into the femoral canal. The ceramic femoral head of the same size as the trial is  placed and the hip is reduced with the same stability parameters. The operative leg is again placed on top of the non-operative leg and the leg lengths are found to be equal.      The wound is then copiously irrigated with saline solution and the capsule and short external rotators are re-attached to the femur through drill holes with Ethibond suture. The fascia lata is closed over a hemovac drain with #1 vicryl suture and the fascia lata, gluteal muscles and subcutaneous tissues are injected with Exparel 20ml  diluted with saline 50ml. The subcutaneous tissues are closed with #1 and2-0 vicryl and the subcuticular layer closed with running 4-0 Monocryl. The drain is hooked to suction, incision cleaned and dried, and steri-srips and a bulky sterile dressing applied. The limb is placed into a knee immobilizer and the patient is awakened and transported to recovery in stable condition.      Please note that a surgical assistant was a medical necessity for this procedure in order to perform it in a safe and expeditious manner. The assistant was necessary to provide retraction to the vital neurovascular structures and to retract and position the limb to allow for anatomic placement of the prosthetic components.  Timothy Wise Timothy Sandt, MD    08/29/2011, 2:31 PM

## 2011-08-29 NOTE — H&P (View-Only) (Signed)
Timothy Wise  DOB: 06-28-1956 Married / Language: English / Race: White / Male  Date of Admission:  08/29/2011  Chief Complaint:  Right Hip Pain  History of Present Illness The patient is a 56 year old male who comes in today for a preoperative History and Physical. The patient is scheduled for a right total hip arthroplasty to be performed by Dr. Gus Wise. Aluisio, MD at [facility] on 08/29/2011. The patient is a 56 year old male who presents with a hip problem. The patient is here today in referral from Dr. Penni Wise.The patient reports right hip problems including pain symptoms that have been present for 7 month(s). The symptoms began without any known injury. Symptoms reported include hip pain (groin, unable to cross right leg over left) and pain with weightbearing (unable to cross right leg over left) Previous workup for this problem has included hip x-rays and hip MRI. Previous treatment for this problem has included nonsteroidal anti-inflammatory drugs (Celebrex, helps with the pain). Timothy Wise does not recall any specific injury which led to this hip problem. Pain is mainly in the groin radiating towards his knee. He has had increased limitations in function and motion especially over the past few months. His regular x-rays showed some subchondral sclerosis and Dr. 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 His symptoms have concurrently worsened. It is definitely limiting what he can and can not do. He has not had any history of significant Prednisone use. He does have history of alcohol use. He drinks six to seven beers a night and has been doing this for years. He is ready to proceed with surgery. They have been treated conservatively in the past for the above stated problem and despite conservative measures, they continue to have progressive pain and severe functional limitations and dysfunction. They have failed non-operative  management. It is felt that they would benefit from undergoing total joint replacement. Risks and benefits of the procedure have been discussed with the patient and they elect to proceed with surgery. There are no active contraindications to surgery such as ongoing infection or rapidly progressive neurological disease.  Allergies No Known Drug Allergies  Medication History CeleBREX (200MG  Capsule, 1 (one) Oral daily, as needed, Taken starting 08/07/2011) Active. Omeprazole (40MG  Capsule DR, Oral daily) Active. Losartan Potassium-HCTZ (100-12.5MG  Tablet, Oral daily) Active. Levothyroxine Sodium ( Tablet, Oral daily) Active.  Problem List/Past Medical Gastroesophageal Reflux Disease Hypothyroidism Avascular necrosis of femur head, right (733.42) Pain, Hip (719.45) Hypertension Hypercholesterolemia  Past Surgical History No pertinent past surgical history  Family History Timothy Wise. Deceased. Age 93 Timothy Wise. Deceased, Cerebrovascular Accident, Alzheimer's disease. Ago 70's Timothy Wise 1. Living. Ag 51 Timothy Wise 1. Living. 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 2-3 Marital status. married Living situation. live with spouse Illicit drug use. no Pain Contract. no Previously in rehab. no Tobacco / smoke exposure. no Exercise. Exercises rarely; does running / walking Current work status. working full time Alcohol use. current drinker; drinks beer; 15 or more per week Children. 0 Post-Surgical Plans. Plan is to go home.  Review of Systems General:Not Present- Chills, Fever, Night Sweats, Fatigue, Weight Gain, Weight Loss and Memory Loss. Skin:Not Present- Hives, Itching, Rash, Eczema and Lesions. HEENT:Not Present- Tinnitus, Headache, Double Vision, Visual Loss, Hearing Loss and Dentures. Respiratory:Not Present- Shortness of breath with exertion, Shortness of breath at rest,  Allergies, Coughing up  blood and Chronic Cough. Cardiovascular:Not Present- Chest Pain, Racing/skipping heartbeats, Difficulty Breathing Lying Down, Murmur, Swelling and Palpitations. Gastrointestinal:Not Present- Bloody Stool, Heartburn, Abdominal Pain, Vomiting, Nausea, Constipation, Diarrhea, Difficulty Swallowing, Jaundice and Loss of appetitie. Male Genitourinary:Not Present- Urinary frequency, Blood in Urine, Weak urinary stream, Discharge, Flank Pain, Incontinence, Painful Urination, Urgency, Urinary Retention and Urinating at Night. Musculoskeletal:Present- Joint Pain and Morning Stiffness. Not Present- Muscle Weakness, Muscle Pain, Joint Swelling, Back Pain and Spasms. Neurological:Not Present- Tremor, Dizziness, Blackout spells, Paralysis, Difficulty with balance and Weakness. Psychiatric:Not Present- Insomnia.  Vitals Weight: 235 lb Height: 74 in Body Surface Area: 2.36 m Body Mass Index: 30.17 kg/m Pulse: 64 (Regular) Resp.: 14 (Unlabored) BP: 142/84 (Sitting, Right Arm, Standard)  Physical Exam The physical exam findings are as follows: Patient is a 56 year old male with continued hip pain.  General Mental Status - Alert, cooperative and good historian. General Appearance- pleasant. Not in acute distress. Orientation- Oriented X3. Build & Nutrition- Well nourished and Well developed.  Head and Neck Head- normocephalic, atraumatic . Neck Global Assessment- supple. no bruit auscultated on the right and no bruit auscultated on the left.  Eye Pupil- Bilateral- Regular and Round. Note: Wears glasses. Motion- Bilateral- EOMI.  Chest and Lung Exam Auscultation: Breath sounds:- clear at anterior chest wall and - clear at posterior chest wall. Adventitious sounds:- No Adventitious sounds.  Cardiovascular Auscultation:Rhythm- Regular rate and rhythm. Heart Sounds- S1 WNL and S2 WNL. Murmurs & Other Heart Sounds: Murmur 1:Location-  Aortic Area. Timing- Mid-systolic. Grade- II/VI. Character- Low pitched.  Abdomen Palpation/Percussion:Tenderness- Abdomen is non-tender to palpation. Rigidity (guarding)- Abdomen is soft. Auscultation:Auscultation of the abdomen reveals - Bowel sounds normal.  Male Genitourinary Not done, not pertinent to present illness  Musculoskeletal Well developed male, alert and oriented in no apparent distress. Left hip flexion about 110, rotation in 30, out 40, abduction 40 without discomfort. Right hip flexion 95, no internal rotation, about 20 external rotation, 20 to 30 abduction. Pulses, sensation and motor are intact both lower extremities. Knee exam is normal. Antalgic gait pattern is noted.  RADIOGRAPHS: Plain radiographs from earlier in the year and it showed that he did not have any bone on bone change in the hip. We also reviewed the MRI. He has an area of high grade osteonecrosis involving close to half the femoral head with some collapse of that segment. He also had some osteonecrosis on the left but no where near as bad as the right.  Impression/Plan Avascular necrosis of femur head, right   Patient is for a Right Total Hip Replacement by Dr. Lequita Halt.  Plan is to go home after surgery.  PCP - Dr. Gershon Crane  Avel Peace, PA-C

## 2011-08-29 NOTE — Anesthesia Preprocedure Evaluation (Addendum)
Anesthesia Evaluation  Patient identified by MRN, date of birth, ID band Patient awake    Reviewed: Allergy & Precautions, H&P , NPO status , Patient's Chart, lab work & pertinent test results  History of Anesthesia Complications Negative for: history of anesthetic complications  Airway Mallampati: II TM Distance: >3 FB Neck ROM: Full    Dental  (+) Teeth Intact and Dental Advisory Given   Pulmonary neg pulmonary ROS,  clear to auscultation  Pulmonary exam normal       Cardiovascular hypertension, Pt. on medications neg cardio ROS Regular Normal    Neuro/Psych  Neuromuscular disease Negative Psych ROS   GI/Hepatic negative GI ROS, Neg liver ROS, GERD-  Medicated,  Endo/Other  Hypothyroidism   Renal/GU negative Renal ROS  Genitourinary negative   Musculoskeletal negative musculoskeletal ROS (+)   Abdominal   Peds  Hematology negative hematology ROS (+)   Anesthesia Other Findings   Reproductive/Obstetrics negative OB ROS                           Anesthesia Physical Anesthesia Plan  ASA: II  Anesthesia Plan: General   Post-op Pain Management:    Induction: Intravenous  Airway Management Planned: Oral ETT  Additional Equipment:   Intra-op Plan:   Post-operative Plan: Extubation in OR  Informed Consent: I have reviewed the patients History and Physical, chart, labs and discussed the procedure including the risks, benefits and alternatives for the proposed anesthesia with the patient or authorized representative who has indicated his/her understanding and acceptance.   Dental advisory given  Plan Discussed with: CRNA  Anesthesia Plan Comments:         Anesthesia Quick Evaluation

## 2011-08-30 LAB — BASIC METABOLIC PANEL
BUN: 10 mg/dL (ref 6–23)
Calcium: 8.7 mg/dL (ref 8.4–10.5)
Creatinine, Ser: 0.88 mg/dL (ref 0.50–1.35)
GFR calc non Af Amer: >90 mL/min (ref >90)
Glucose, Bld: 114 mg/dL — ABNORMAL HIGH (ref 70–99)

## 2011-08-30 LAB — CBC
HCT: 36 % — ABNORMAL LOW (ref 39.0–52.0)
Hemoglobin: 12.3 g/dL — ABNORMAL LOW (ref 13.0–17.0)
MCH: 32.5 pg (ref 26.0–34.0)
MCHC: 34.2 g/dL (ref 30.0–36.0)

## 2011-08-30 MED ORDER — OMEPRAZOLE 20 MG PO CPDR
40.0000 mg | DELAYED_RELEASE_CAPSULE | Freq: Every day | ORAL | Status: DC
  Administered 2011-08-30 – 2011-08-31 (×2): 40 mg via ORAL
  Filled 2011-08-30 (×3): qty 2

## 2011-08-30 MED ORDER — NON FORMULARY: 40.0000 mg | Freq: Every day | Status: DC

## 2011-08-30 NOTE — Progress Notes (Signed)
Physical Therapy Treatment Patient Details Name: Timothy Wise MRN: [id number] DOB: November 02, 1955 Today's Date: 08/30/2011  PT Assessment/Plan  PT - Assessment/Plan Comments on Treatment Session: Happy to walk the hallway, but demonstrates interesting methods to achieve step through pattern.  Feel will self correct as increased motion occurs with therex.  Noted increased groin pain with hip abduction RN notified may need muscle relaxor. PT Plan: Discharge plan remains appropriate PT Frequency: 7X/week Follow Up Recommendations: Home health PT Equipment Recommended: None recommended by PT PT Goals  Acute Rehab PT Goals Pt will go Sit to Stand: with supervision PT Goal: Sit to Stand - Progress: Progressing toward goal Pt will go Stand to Sit: with supervision PT Goal: Stand to Sit - Progress: Progressing toward goal Pt will Ambulate: >150 feet;with supervision;with rolling walker PT Goal: Ambulate - Progress: Met Pt will Perform Home Exercise Program: with supervision, verbal cues required/provided PT Goal: Perform Home Exercise Program - Progress: Progressing toward goal  PT Treatment Precautions/Restrictions  Precautions Precautions: Posterior Hip Restrictions Weight Bearing Restrictions: Yes RLE Weight Bearing: Partial weight bearing RLE Partial Weight Bearing Percentage or Pounds: 25-50% Mobility (including Balance) Transfers Sit to Stand: From chair/3-in-1;With armrests;4: Min assist Sit to Stand Details (indicate cue type and reason): cues for technique Stand to Sit: To chair/3-in-1;4: Min assist;With armrests Stand to Sit Details: cues for technique Ambulation/Gait Ambulation/Gait Assistance: 5: Supervision Ambulation/Gait Assistance Details (indicate cue type and reason): demonstrates step through sequence, but utilizing compensatory techniques due to still with limited hip extension (excessive tibial external rotation and ankle foot supination/inversion); cues for  [facility] Ambulation Distance (Feet): 400 Feet Assistive device: Rolling walker    Exercise  Total Joint Exercises Ankle Circles/Pumps: AROM;Both;10 reps;Seated Quad Sets: AROM;Right;10 reps;Seated Hip ABduction/ADduction: AAROM;Right;5 reps;Seated End of Session PT - End of Session Equipment Utilized During Treatment: Gait belt Activity Tolerance: Patient tolerated treatment well Patient left: in chair General Behavior During Session: [facility] for tasks performed Cognition: [facility] for tasks performed  [facility] 08/30/2011, 5:37 PM

## 2011-08-30 NOTE — Progress Notes (Signed)
CSW consulted for SNF placement. PN reviewed. PT is recommending HHPT upon d/c. CSW is available to assist with d/c planning if plan changes and SNF is needed.

## 2011-08-30 NOTE — Progress Notes (Signed)
Subjective: 1 Day Post-Op Procedure(s) (LRB): TOTAL HIP ARTHROPLASTY (Right) Patient reports pain as mild.   Patient seen in rounds with Dr. Lequita Halt. Patient has complaints of a rough night but better today. We will start therapy today. Plan is to go home after hospital stay.  Objective: Vital signs in last 24 hours: Temp:  [97 F (36.1 C)-98 F (36.7 C)] 97.7 F (36.5 C) (02/14 0605) Pulse Rate:  [60-78] 67  (02/14 0605) Resp:  [16-20] 18  (02/14 0605) BP: (122-156)/(75-94) 127/78 mmHg (02/14 0605) SpO2:  [94 %-100 %] 97 % (02/14 0605) Weight:  [113.399 kg (250 lb)] 113.399 kg (250 lb) (02/13 1553)  Intake/Output from previous day:  Intake/Output Summary (Last 24 hours) at 08/30/11 0747 Last data filed at 08/30/11 0656  Gross per 24 hour  Intake   4675 ml  Output   4345 ml  Net    330 ml    Labs:  Basename 08/30/11 0417  HGB 12.3*    Basename 08/30/11 0417  WBC 11.2*  RBC 3.78*  HCT 36.0*  PLT 210    Basename 08/30/11 0417  NA 137  K 3.7  CL 100  CO2 26  BUN 10  CREATININE 0.88  GLUCOSE 114*  CALCIUM 8.7   No results found for this basename: LABPT:2,INR:2 in the last 72 hours  Exam - Neurovascular intact Sensation intact distally Dressing - clean, dry, no drainage Motor function intact - moving foot and toes well on exam.  Hemovac pulled without difficulty.  Past Medical History  Diagnosis Date  . Allergic rhinitis   . GERD (gastroesophageal reflux disease)   . Hypertension   . Hypothyroidism   . Hematuria     benign (had negative work up per Dr. Ernestine Conrad in [facility])  . Thumb fracture   . Arthritis 08-22-11    Rt. hip avascular necrosis- surgery planned.    Assessment/Plan: 1 Day Post-Op Procedure(s) (LRB): TOTAL HIP ARTHROPLASTY (Right) Active Problems:  * No active hospital problems. *    Advance diet Up with therapy Discharge home with home health  DVT Prophylaxis - Xarelto Protocol Partial-Weight Bearing 25-50% right  Leg D/C Knee Immobilizer Hemovac Pulled Begin Therapy Hip Preacutions Keep foley until tomorrow. No vaccines.  Timothy Wise 08/30/2011, 7:47 AM

## 2011-08-30 NOTE — Evaluation (Signed)
Physical Therapy Evaluation Patient Details Name: Timothy Wise MRN: [id number] DOB: 01-Aug-1955 Today's Date: 08/30/2011  Problem List:  Patient Active Problem List  Diagnoses  . HYPOTHYROIDISM  . HYPERTENSION  . ALLERGIC RHINITIS  . GERD  . KNEE PAIN  . SUBACROMIAL BURSITIS  . PERONEAL NEUROPATHY    Past Medical History:  Past Medical History  Diagnosis Date  . Allergic rhinitis   . GERD (gastroesophageal reflux disease)   . Hypertension   . Hypothyroidism   . Hematuria     benign (had negative work up per Dr. Ernestine Conrad in [facility])  . Thumb fracture   . Arthritis 08-22-11    Rt. hip avascular necrosis- surgery planned.   Past Surgical History:  Past Surgical History  Procedure Date  . Colonoscopy 08-13-08    per Dr. Juanda Chance, repeat 10 yrs     PT Assessment/Plan/Recommendation PT Assessment Clinical Impression Statement: Patient s/p right THA presents with decreased independence with mobility due to decreased strength, AROM and pain right LE with hip precautions.  Will benefit from skilled PT in acute setting to maximize independence with mobility to allow d/c home with HHPT. PT Recommendation/Assessment: Patient will need skilled PT in the acute care venue PT Problem List: Decreased strength;Decreased range of motion;Decreased activity tolerance;Decreased mobility;Pain;Decreased knowledge of use of DME;Decreased knowledge of precautions PT Therapy Diagnosis : Acute pain;Difficulty walking PT Plan PT Frequency: 7X/week PT Treatment/Interventions: Gait training;DME instruction;Stair training;Functional mobility training;Therapeutic activities;Therapeutic exercise;Patient/family education PT Recommendation Follow Up Recommendations: Home health PT Equipment Recommended: None recommended by PT (OT likely to recommend 3:1 and tub bench) PT Goals  Acute Rehab PT Goals PT Goal Formulation: With patient Time For Goal Achievement: 5 days Pt will go Supine/Side to Sit: with  supervision PT Goal: Supine/Side to Sit - Progress: Goal set today Pt will go Sit to Supine/Side: with supervision PT Goal: Sit to Supine/Side - Progress: Goal set today Pt will go Sit to Stand: with supervision PT Goal: Sit to Stand - Progress: Goal set today Pt will go Stand to Sit: with supervision PT Goal: Stand to Sit - Progress: Goal set today Pt will Ambulate: >150 feet;with supervision;with rolling walker PT Goal: Ambulate - Progress: Goal set today Pt will Go Up / Down Stairs: 3-5 stairs;with rail(s);with crutches;with min assist PT Goal: Up/Down Stairs - Progress: Goal set today Pt will Perform Home Exercise Program: with supervision, verbal cues required/provided PT Goal: Perform Home Exercise Program - Progress: Goal set today  PT Evaluation Precautions/Restrictions  Precautions Precautions: Posterior Hip Restrictions Weight Bearing Restrictions: Yes RLE Weight Bearing: Partial weight bearing RLE Partial Weight Bearing Percentage or Pounds: 25-50% Prior Functioning  Home Living Lives With: Spouse Type of Home: House Home Layout: Two level;Able to live on main level with bedroom/bathroom Home Access: Stairs to enter;Ramped entrance Entrance Stairs-Rails: Right Entrance Stairs-Number of Steps: 4 Bathroom Shower/Tub: Tub/shower unit Home Adaptive Equipment: Walker - rolling;Wheelchair - manual Additional Comments: No bath equip Prior Function Level of Independence: Independent with transfers;Independent with basic ADLs;Independent with gait Driving: Yes Cognition Cognition Arousal/Alertness: Awake/alert Overall Cognitive Status: Appears within functional limits for tasks assessed Sensation/Coordination   Extremity Assessment RLE Assessment RLE Assessment: Exceptions to [facility] RLE AROM (degrees) Overall AROM Right Lower Extremity: Deficits;Due to pain RLE Overall AROM Comments: limited with pain and precautions.  Needed assist to keep leg from inwardly  rotating RLE Strength RLE Overall Strength Comments: ankle WFL, able to contract quad, otherwise not tested LLE Assessment LLE Assessment: Within Functional Limits  Mobility (including Balance) Bed Mobility Bed Mobility: Yes Supine to Sit: 4: Min assist Supine to Sit Details (indicate cue type and reason): for right LE and cues for technique Sitting - Scoot to Edge of Bed: 4: Min assist Sitting - Scoot to [place] of Bed Details (indicate cue type and reason): for right LE Transfers Transfers: Yes Sit to Stand: 4: Min assist;From elevated surface;With upper extremity assist;From bed Sit to Stand Details (indicate cue type and reason): cues for technique and elevated height of bed Stand to Sit: 4: Min assist;To chair/3-in-1;With armrests Stand to Sit Details: cues for technique and leg out for precautions Ambulation/Gait Ambulation/Gait: Yes Ambulation/Gait Assistance: 4: Min assist Ambulation/Gait Assistance Details (indicate cue type and reason): cues for PWB, sequence and walker safety Ambulation Distance (Feet): 120 Feet Assistive device: Rolling walker Gait Pattern: Step-to pattern    Exercise  Total Joint Exercises Ankle Circles/Pumps: AROM;Both;10 reps;Supine Quad Sets: AROM;Right;10 reps;Supine Heel Slides: AAROM;Right;Supine (7 reps) End of Session PT - End of Session Equipment Utilized During Treatment: Gait belt Activity Tolerance: Patient tolerated treatment well Patient left: in chair;with call bell in reach General Behavior During Session: [facility] for tasks performed Cognition: [facility] for tasks performed  [facility] 08/30/2011, 11:37 AM

## 2011-08-31 DIAGNOSIS — E871 Hypo-osmolality and hyponatremia: Secondary | ICD-10-CM | POA: Diagnosis not present

## 2011-08-31 LAB — BASIC METABOLIC PANEL
CO2: 30 mEq/L (ref 19–32)
Calcium: 8.9 mg/dL (ref 8.4–10.5)
Chloride: 98 mEq/L (ref 96–112)
Creatinine, Ser: 0.99 mg/dL (ref 0.50–1.35)
Glucose, Bld: 117 mg/dL — ABNORMAL HIGH (ref 70–99)
Sodium: 134 mEq/L — ABNORMAL LOW (ref 135–145)

## 2011-08-31 LAB — CBC
Hemoglobin: 11.7 g/dL — ABNORMAL LOW (ref 13.0–17.0)
MCH: 31.9 pg (ref 26.0–34.0)
MCV: 96.2 fL (ref 78.0–100.0)
Platelets: 204 10*3/uL (ref 150–400)
RBC: 3.67 MIL/uL — ABNORMAL LOW (ref 4.22–5.81)
WBC: 10.7 10*3/uL — ABNORMAL HIGH (ref 4.0–10.5)

## 2011-08-31 MED ORDER — METHOCARBAMOL 500 MG PO TABS: 500.0000 mg | ORAL_TABLET | Freq: Four times a day (QID) | ORAL | Status: AC | PRN

## 2011-08-31 MED ORDER — RIVAROXABAN 10 MG PO TABS: 10.0000 mg | ORAL_TABLET | Freq: Every day | ORAL | Status: DC

## 2011-08-31 MED ORDER — OXYCODONE HCL 5 MG PO TABS: 5.0000 mg | ORAL_TABLET | ORAL | Status: AC | PRN

## 2011-08-31 NOTE — Progress Notes (Signed)
Subjective: 2 Days Post-Op Procedure(s) (LRB): TOTAL HIP ARTHROPLASTY (Right) Patient reports pain as mild.   Patient seen in rounds with Dr. Lequita Halt. Patient doing well this morning.  Ambulation Distance (Feet) 400 Feet yesterday. Should do well. Probably home this afternoon after two sessions. Will set up discharge for later.  Objective: Vital signs in last 24 hours: Temp:  [98 F (36.7 C)-98.4 F (36.9 C)] 98.4 F (36.9 C) (02/15 0510) Pulse Rate:  [65-89] 89  (02/15 0510) Resp:  [16-18] 18  (02/15 0510) BP: (118-142)/(68-79) 118/68 mmHg (02/15 0510) SpO2:  [92 %-96 %] 92 % (02/15 0510)  Intake/Output from previous day:  Intake/Output Summary (Last 24 hours) at 08/31/11 0838 Last data filed at 08/31/11 0510  Gross per 24 hour  Intake 1588.01 ml  Output   4460 ml  Net -2871.99 ml    Intake/Output this shift: UOP 1500  Labs: Results for orders placed during the hospital encounter of 08/29/11  TYPE AND SCREEN      Component Value Range   ABO/RH(D) A POS     Antibody Screen NEG     Sample Expiration 09/01/2011    ABO/RH      Component Value Range   ABO/RH(D) A POS    CBC      Component Value Range   WBC 11.2 (*) 4.0 - 10.5 (K/uL)   RBC 3.78 (*) 4.22 - 5.81 (MIL/uL)   Hemoglobin 12.3 (*) 13.0 - 17.0 (g/dL)   HCT 16.1 (*) 09.6 - 52.0 (%)   MCV 95.2  78.0 - 100.0 (fL)   MCH 32.5  26.0 - 34.0 (pg)   MCHC 34.2  30.0 - 36.0 (g/dL)   RDW 04.5  40.9 - 81.1 (%)   Platelets 210  150 - 400 (K/uL)  BASIC METABOLIC PANEL      Component Value Range   Sodium 137  135 - 145 (mEq/L)   Potassium 3.7  3.5 - 5.1 (mEq/L)   Chloride 100  96 - 112 (mEq/L)   CO2 26  19 - 32 (mEq/L)   Glucose, Bld 114 (*) 70 - 99 (mg/dL)   BUN 10  6 - 23 (mg/dL)   Creatinine, Ser 9.14  0.50 - 1.35 (mg/dL)   Calcium 8.7  8.4 - 78.2 (mg/dL)   GFR calc non Af Amer >90  >90 (mL/min)   GFR calc Af Amer >90  >90 (mL/min)  CBC      Component Value Range   WBC 10.7 (*) 4.0 - 10.5 (K/uL)   RBC 3.67 (*)  4.22 - 5.81 (MIL/uL)   Hemoglobin 11.7 (*) 13.0 - 17.0 (g/dL)   HCT 95.6 (*) 21.3 - 52.0 (%)   MCV 96.2  78.0 - 100.0 (fL)   MCH 31.9  26.0 - 34.0 (pg)   MCHC 33.1  30.0 - 36.0 (g/dL)   RDW 08.6  57.8 - 46.9 (%)   Platelets 204  150 - 400 (K/uL)  BASIC METABOLIC PANEL      Component Value Range   Sodium 134 (*) 135 - 145 (mEq/L)   Potassium 3.5  3.5 - 5.1 (mEq/L)   Chloride 98  96 - 112 (mEq/L)   CO2 30  19 - 32 (mEq/L)   Glucose, Bld 117 (*) 70 - 99 (mg/dL)   BUN 8  6 - 23 (mg/dL)   Creatinine, Ser 6.29  0.50 - 1.35 (mg/dL)   Calcium 8.9  8.4 - 52.8 (mg/dL)   GFR calc non Af Amer >90  >90 (  mL/min)   GFR calc Af Amer >90  >90 (mL/min)    Exam: Neurovascular intact Sensation intact distally Incision - clean, dry, no drainage Motor function intact - moving foot and toes well on exam.   Assessment/Plan: 2 Days Post-Op Procedure(s) (LRB): TOTAL HIP ARTHROPLASTY (Right)  Procedure(s) (LRB): TOTAL HIP ARTHROPLASTY (Right) Past Medical History  Diagnosis Date  . Allergic rhinitis   . GERD (gastroesophageal reflux disease)   . Hypertension   . Hypothyroidism   . Hematuria     benign (had negative work up per Dr. Ernestine Conrad in [facility])  . Thumb fracture   . Arthritis 08-22-11    Rt. hip avascular necrosis- surgery planned.   Active Problems:  * No active hospital problems. *    Advance diet Up with therapy Discharge home with home health Diet - heart healthy Follow up - in 2 weeks Activity - PWB Condition Upon Discharge - Good D/C Meds - See DC Summary DVT Prophylaxis - Xarelto Protocol   Timothy Wise 08/31/2011, 8:38 AM

## 2011-08-31 NOTE — Progress Notes (Signed)
Occupational Therapy Treatment Patient Details Name: Timothy Wise MRN: [id number] DOB: [birth year] Today's Date: 08/31/2011  OT Assessment/Plan OT Assessment/Plan OT Frequency: Min 2X/week Follow Up Recommendations: Home health OT Equipment Recommended: Other (comment) (3:1 delivered; pt is considering tub bench) OT Goals Acute Rehab OT Goals OT Goal Formulation: With patient Time For Goal Achievement: 7 days ADL Goals Pt Will Perform Grooming: with supervision;Standing at sink ADL Goal: Grooming - Progress: Met Pt Will Transfer to Toilet: with supervision;Ambulation;3-in-1 (all aspects) ADL Goal: Toilet Transfer - Progress: Goal set today Pt Will Perform Tub/Shower Transfer: Tub transfer;Transfer tub bench;with min assist;Ambulation (maintaining THPS) ADL Goal: Tub/Shower Transfer - Progress: MET Miscellaneous OT Goals Miscellaneous OT Goal #1: Pt will verbalize precautions, identifying internal rotation to attend to, and state that sock aid needs to be positioned laterally to RLE when asked OT Goal: Miscellaneous Goal #1 - Progress: Goal set today  OT Treatment Precautions/Restrictions  Precautions Precautions: Posterior Hip Precaution Comments: Pt recalls 3/3 THP Required Braces or Orthoses: No Restrictions Weight Bearing Restrictions: Yes RLE Weight Bearing: Partial weight bearing RLE Partial Weight Bearing Percentage or Pounds: 25-50%   ADL ADL Grooming: Performed; supervision Where Assessed - Standing at sink Tub/Shower Transfer: Performed;Minimal assistance Tub/Shower Transfer Details (indicate cue type and reason): min cues for internal rotation Tub/Shower Transfer Method: Science writer: Counsellor Used: Reacher Ambulation Related to ADLs:  (pt internally rotates RLE--cues to keep in neutral)  Mobility  Bed Mobility Bed Mobility: Yes Supine to Sit: 4: Min assist Supine to Sit Details (indicate cue type and reason):  min vcs Sit to Supine: 4: Min assist Sit to Supine - Details (indicate cue type and reason): demon and instructed pt how to use a bed sheet to assist R LE up onto bed Transfers Sit to Stand: min A; min vcs Exercises    End of Session OT - End of Session Activity Tolerance: Patient limited by pain Patient left: in chair;with call bell in reach General Behavior During Session: [facility] for tasks performed Cognition: [facility] for tasks performed [facility], OTR/L [phone] 08/31/2011 Timothy Wise  08/31/2011, 1:43 PM

## 2011-08-31 NOTE — Discharge Summary (Signed)
Physician Discharge Summary   Patient ID: Timothy Wise MRN: [id number] DOB/AGE: 55-13-1957 55 y.o.  Admit date: 08/29/2011 Discharge date: 08/31/2011  Primary Diagnosis: Avascular necrosis of femur head, right  Admission Diagnoses: Past Medical History  Diagnosis Date  . Allergic rhinitis   . GERD (gastroesophageal reflux disease)   . Hypertension   . Hypothyroidism   . Hematuria     benign (had negative work up per Dr. Ernestine Conrad in [facility])  . Thumb fracture   . Arthritis 08-22-11    Rt. hip avascular necrosis- surgery planned.   Discharge Diagnoses:  Active Problems:  Postop Hyponatremia  Procedure: Procedure(s) (LRB): TOTAL HIP ARTHROPLASTY (Right)   Consults: None  HPI: ZARIN KNUPP is a 56 y.o. 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 Pain occurs with activity and rest including pain at night. He has tried analgesics, protected weight bearing and rest without benefit. Pain is too severe to attempt physical therapy. MRI demonstrates high grade AVN of the femoral head with subtotal head involvement. He presents now for right THA.  Laboratory Data: Hospital Outpatient Visit on 08/22/2011  Component Date Value Range Status  . MRSA, PCR  08/22/2011 NEGATIVE  NEGATIVE Final  . Staphylococcus aureus  08/22/2011 POSITIVE* NEGATIVE Final   Comment:                                 The Xpert SA Assay (FDA                          approved for NASAL specimens                          only), is one component of                          a comprehensive surveillance                          program.  It is not intended                          to diagnose infection nor to                          guide or monitor treatment.  Marland Kitchen aPTT (seconds) 08/22/2011 27  24-37 Final  . WBC (K/uL) 08/22/2011 6.8  4.0-10.5 Final  . RBC (MIL/uL) 08/22/2011 4.69  4.22-5.81 Final  . Hemoglobin (g/dL) 40/98/1191 47.8  29.5-62.1 Final  . HCT (%)  08/22/2011 44.8  39.0-52.0 Final  . MCV (fL) 08/22/2011 95.5  78.0-100.0 Final  . MCH (pg) 08/22/2011 32.4  26.0-34.0 Final  . MCHC (g/dL) 30/86/5784 69.6  29.5-28.4 Final  . RDW (%) 08/22/2011 12.3  11.5-15.5 Final  . Platelets (K/uL) 08/22/2011 247  150-400 Final  . Sodium (mEq/L) 08/22/2011 135  135-145 Final  . Potassium (mEq/L) 08/22/2011 4.1  3.5-5.1 Final  . Chloride (mEq/L) 08/22/2011 100  96-112 Final  . CO2 (mEq/L) 08/22/2011 27  19-32 Final  . Glucose, Bld (mg/dL) 13/24/4010 272* 53-66 Final  . BUN (mg/dL) 44/09/4740 15  5-95 Final  . Creatinine, Ser (mg/dL) 63/87/5643 3.29  5.18-8.41 Final  . Calcium (mg/dL)  08/22/2011 10.1  8.4-10.5 Final  . Total Protein (g/dL) 16/04/9603 7.4  5.4-0.9 Final  . Albumin (g/dL) 81/19/1478 3.7  2.9-5.6 Final  . AST (U/L) 08/22/2011 27  0-37 Final  . ALT (U/L) 08/22/2011 48  0-53 Final  . Alkaline Phosphatase (U/L) 08/22/2011 114  39-117 Final  . Total Bilirubin (mg/dL) 21/30/8657 0.5  8.4-6.9 Final  . GFR calc non Af Amer (mL/min) 08/22/2011 >90  >90 Final  . GFR calc Af Amer (mL/min) 08/22/2011 >90  >90 Final   Comment:                                 The eGFR has been calculated                          using the CKD EPI equation.                          This calculation has not been                          validated in all clinical                          situations.                          eGFR's persistently                          <90 mL/min signify                          possible Chronic Kidney Disease.  Marland Kitchen Prothrombin Time (seconds) 08/22/2011 12.0  11.6-15.2 Final  . INR  08/22/2011 0.87  0.00-1.49 Final  . Color, Urine  08/22/2011 YELLOW  YELLOW Final  . APPearance  08/22/2011 CLEAR  CLEAR Final  . Specific Gravity, Urine  08/22/2011 1.018  1.005-1.030 Final  . pH  08/22/2011 7.5  5.0-8.0 Final  . Glucose, UA (mg/dL) 62/95/2841 NEGATIVE  NEGATIVE Final  . Hgb urine dipstick  08/22/2011 NEGATIVE  NEGATIVE Final  .  Bilirubin Urine  08/22/2011 NEGATIVE  NEGATIVE Final  . Ketones, ur (mg/dL) 32/44/0102 NEGATIVE  NEGATIVE Final  . Protein, ur (mg/dL) 72/53/6644 NEGATIVE  NEGATIVE Final  . Urobilinogen, UA (mg/dL) 03/47/4259 0.2  5.6-3.8 Final  . Nitrite  08/22/2011 NEGATIVE  NEGATIVE Final  . Leukocytes, UA  08/22/2011 NEGATIVE  NEGATIVE Final   MICROSCOPIC NOT DONE ON URINES WITH NEGATIVE PROTEIN, BLOOD, LEUKOCYTES, NITRITE, OR GLUCOSE <1000 mg/dL.    Basename 08/31/11 0339 08/30/11 0417  HGB 11.7* 12.3*    Basename 08/31/11 0339 08/30/11 0417  WBC 10.7* 11.2*  RBC 3.67* 3.78*  HCT 35.3* 36.0*  PLT 204 210    Basename 08/31/11 0339 08/30/11 0417  NA 134* 137  K 3.5 3.7  CL 98 100  CO2 30 26  BUN 8 10  CREATININE 0.99 0.88  GLUCOSE 117* 114*  CALCIUM 8.9 8.7   No results found for this basename: LABPT:2,INR:2 in the last 72 hours  X-Rays:Dg Chest 2 View  08/22/2011  *RADIOLOGY REPORT*  Clinical Data: Preop right hip replacement  CHEST - 2 VIEW  Comparison: None.  Findings: Calcified  granuloma at the right lung base.  Lungs otherwise essentially clear.  No pleural effusion or pneumothorax.  Cardiomediastinal silhouette is within normal limits.  Mild degenerative changes of the visualized thoracolumbar spine.  IMPRESSION: No evidence of acute cardiopulmonary disease.  Original Report Authenticated By: Charline Bills, M.D.   Dg Pelvis Portable  08/29/2011  *RADIOLOGY REPORT*  Clinical Data: Hip replacement.  PORTABLE PELVIS  Comparison: None.  Findings: The patient has a new right total hip replacement. Surgical drain and gas in the soft tissues are noted.  The device is located and there is no fracture.  Left hip degenerative disease is identified.  IMPRESSION: Right total hip without evidence of complication.  Original Report Authenticated By: Bernadene Bell. D'ALESSIO, M.D.   Dg Hip Portable 1 View Right  08/29/2011  *RADIOLOGY REPORT*  Clinical Data: Hip pain  PORTABLE RIGHT HIP - 1 VIEW   Comparison: None.  Findings: Portable film demonstrates right total hip replacement with satisfactory acetabular and femoral components.  No adverse features.  Surgical drain.  IMPRESSION: As above.  Original Report Authenticated By: Elsie Stain, M.D.    EKG: Orders placed during the hospital encounter of 08/22/11  . EKG 12-LEAD  . EKG 12-LEAD     Hospital Course: Patient was admitted to [facility] and taken to the OR and underwent the above state procedure without complications.  Patient tolerated the procedure well and was later transferred to the recovery room and then to the orthopaedic floor for postoperative care.  They were given PO and IV analgesics for pain control following their surgery.  They were given 24 hours of postoperative antibiotics and started on DVT prophylaxis.   PT and OT were ordered for total joint protocol.  Discharge planning consulted to help with postop disposition and equipment needs.  Patient had a rough night on the evening of surgery and started to get up with therapy on day one when he was feeling better. Hemovac drain was pulled without difficulty.  He did very well with PT and walked over 400 feet. He continued to progress with therapy into day two.  Dressing was changed on day two and the incision was healing well. Patient was seen in rounds and was ready to go home on day 2 after two sessions of PT.  Discharge Medications: Prior to Admission medications   Medication Sig Start Date End Date Taking? Authorizing Provider  levothyroxine (SYNTHROID, LEVOTHROID) 25 MCG tablet Take 25 mcg by mouth daily with breakfast. 12/08/10 12/08/11 Yes Nelwyn Salisbury, MD  losartan-hydrochlorothiazide [facility]) 100-12.5 MG per tablet Take 1 tablet by mouth daily with breakfast. 12/08/10 12/08/11 Yes Nelwyn Salisbury, MD  omeprazole (PRILOSEC) 40 MG capsule Take 1 capsule (40 mg total) by mouth daily. 05/07/11 05/06/12 Yes Nelwyn Salisbury, MD  celecoxib (CELEBREX) 200 MG capsule  Take 200 mg by mouth daily as needed. Pain    Historical Provider, MD  methocarbamol (ROBAXIN) 500 MG tablet Take 1 tablet (500 mg total) by mouth every 6 (six) hours as needed. 08/31/11 09/10/11  Gerilyn Stargell, PA  oxyCODONE (OXY IR/ROXICODONE) 5 MG immediate release tablet Take 1-2 tablets (5-10 mg total) by mouth every 3 (three) hours as needed. 08/31/11 09/10/11  Sache Sane Julien Girt, PA  rivaroxaban (XARELTO) 10 MG TABS tablet Take 1 tablet (10 mg total) by mouth daily with breakfast. 08/31/11   Leven Hoel Julien Girt, PA    Diet: heart healthy  Activity:PWB 25-50% No bending hip over 90 degrees- A "L" Angle Do not  cross legs Do not let foot roll inward  When turning these patients a pillow should be placed between the patient's legs to prevent crossing.  Patients should have the affected knee fully extended when trying to sit or stand from all surfaces to prevent excessive hip flexion.  When ambulating and turning toward the affected side the affected leg should have the toes turned out prior to moving the walker and the rest of patient's body as to prevent internal rotation/ turning in of the leg.  Abduction pillows are the most effective way to prevent a patient from not crossing legs or turning toes in at rest. If an abduction pillow is not ordered placing a regular pillow length wise between the patient's legs is also an effective reminder.  It is imperative that these precautions be maintained so that the surgical hip does not dislocate.    Follow-up:in 2 weeks  Disposition: Home after therapy  Discharged Condition: good   Discharge Orders    Future Orders Please Complete By Expires   Diet - low sodium heart healthy      Call MD / Call 911      Comments:   If you experience chest pain or shortness of breath, CALL 911 and be transported to the hospital emergency room.  If you develope a fever above 101 F, pus (white drainage) or increased drainage or redness at the wound,  or calf pain, call your surgeon's office.   Constipation Prevention      Comments:   Drink plenty of fluids.  Prune juice may be helpful.  You may use a stool softener, such as Colace (over the counter) 100 mg twice a day.  Use MiraLax (over the counter) for constipation as needed.   Increase activity slowly as tolerated      Weight Bearing as taught in Physical Therapy      Comments:   Use a walker or crutches as instructed.   Discharge instructions      Comments:   Pick up stool softner and laxative for home. Do not submerge incision under water. May shower. Continue to use ice for pain and swelling from surgery. Hip precautions.  Total Hip Protocol.   Driving restrictions      Comments:   No driving   Lifting restrictions      Comments:   No lifting   Follow the hip precautions as taught in Physical Therapy      Change dressing      Comments:   You may change your dressing daily with sterile 4 x 4 inch gauze dressing and paper tape.=   TED hose      Comments:   Use stockings (TED hose) for 3 weeks on both leg(s).  You may remove them at night for sleeping.     Medication List  As of 08/31/2011  8:49 AM   STOP taking these medications         Fish Oil 1200 MG Caps      Garlic 1000 MG Caps         TAKE these medications         celecoxib 200 MG capsule   Commonly known as: CELEBREX   Take 200 mg by mouth daily as needed. Pain      levothyroxine 25 MCG tablet   Commonly known as: SYNTHROID, LEVOTHROID   Take 25 mcg by mouth daily with breakfast.      losartan-hydrochlorothiazide 100-12.5 MG per tablet   Commonly known  asMauri Reading   Take 1 tablet by mouth daily with breakfast.      methocarbamol 500 MG tablet   Commonly known as: ROBAXIN   Take 1 tablet (500 mg total) by mouth every 6 (six) hours as needed.      omeprazole 40 MG capsule   Commonly known as: PRILOSEC   Take 1 capsule (40 mg total) by mouth daily.      oxyCODONE 5 MG immediate release tablet     Commonly known as: Oxy IR/ROXICODONE   Take 1-2 tablets (5-10 mg total) by mouth every 3 (three) hours as needed.      rivaroxaban 10 MG Tabs tablet   Commonly known as: XARELTO   Take 1 tablet (10 mg total) by mouth daily with breakfast.           Follow-up Information    Follow up with Loanne Drilling, MD. Schedule an appointment as soon as possible for a visit in 2 weeks.   Contact information:   [facility] [address], [address] [place] [address] [phone]          Signed: Patrica Duel 08/31/2011, 8:49 AM

## 2011-08-31 NOTE — Progress Notes (Signed)
Physical Therapy Treatment Patient Details Name: DANIELA HERNAN MRN: [id number] DOB: December 23, 1955 Today's Date: 08/31/2011  R THA POD #2 pm session 13:45 - 14:05 1 gt  PT Assessment/Plan  PT - Assessment/Plan Comments on Treatment Session: Pt ready for D/C instructions.  Pt voided and amb in hallway, completing his 2nd PT session. PT Plan: Discharge plan remains appropriate Follow Up Recommendations: Home health PT Equipment Recommended: Other (comment) (equipment delivered and in room) PT Goals  Acute Rehab PT Goals PT Goal Formulation: With patient Pt will go Supine/Side to Sit: with supervision PT Goal: Supine/Side to Sit - Progress: Met Pt will go Sit to Supine/Side: with supervision PT Goal: Sit to Supine/Side - Progress: Met Pt will go Sit to Stand: with supervision PT Goal: Sit to Stand - Progress: Met Pt will go Stand to Sit: with supervision PT Goal: Stand to Sit - Progress: Met Pt will Ambulate: >150 feet;with supervision;with rolling walker PT Goal: Ambulate - Progress: Met Pt will Perform Home Exercise Program: with supervision, verbal cues required/provided PT Goal: Perform Home Exercise Program - Progress: Met  PT Treatment Precautions/Restrictions  Precautions Precautions: Posterior Hip Precaution Comments: Pt recalls 3/3 THP Required Braces or Orthoses: No Restrictions Weight Bearing Restrictions: Yes RLE Weight Bearing: Partial weight bearing RLE Partial Weight Bearing Percentage or Pounds: 25-50% Mobility (including Balance) Bed Mobility Bed Mobility: Yes Supine to Sit: 5: Supervision Supine to Sit Details (indicate cue type and reason): increased time and pt used a bed sheet to assit R LE off bed Sitting - Scoot to Edge of Bed: 5: Supervision Sitting - Scoot to [place] of Bed Details (indicate cue type and reason): increased time Sit to Supine: 4: Min assist Sit to Supine - Details (indicate cue type and reason): demon and instructed pt how to use a bed  sheet to assist R LE up onto bed Transfers Transfers: Yes Sit to Stand: 5: Supervision;From bed Sit to Stand Details (indicate cue type and reason): one VC to extend R LE to avoid hip flex >90' Stand to Sit: To chair/3-in-1;5: Supervision Stand to Sit Details: good safety tech and increased time Ambulation/Gait Ambulation/Gait: Yes Ambulation/Gait Assistance: 5: Supervision Ambulation/Gait Assistance Details (indicate cue type and reason): one VC with safety during backward gait Ambulation Distance (Feet): 250 Feet Assistive device: Rolling walker Gait Pattern: Step-to pattern;Decreased stance time - right;Trunk flexed Stairs: No (Pt has a ramp) Wheelchair Mobility Wheelchair Mobility: No    End of Session PT - End of Session Equipment Utilized During Treatment: Gait belt Activity Tolerance: Patient tolerated treatment well Patient left: in chair;with call bell in reach (ICE on R hip) Nurse Communication: Other (comment) (Pt ready for D/C instructions) General Behavior During Session: [facility] for tasks performed Cognition: [facility] for tasks performed  Felecia Shelling  PTA [facility]  Acute  Rehab Pager     [phone]

## 2011-08-31 NOTE — Progress Notes (Signed)
Physical Therapy Treatment Patient Details Name: Timothy Wise MRN: [id number] DOB: 1956-03-31 Today's Date: 08/31/2011  R THA POD #2 am session 10:50 - 11:30 1 ta  1 te  1 gt  PT Assessment/Plan  PT - Assessment/Plan Comments on Treatment Session: Pt given handout on THR HEP and performed all.  Pt has a ramp to enter home.  Pt plans to D/C today after x2 PT sessions. PT Plan: Discharge plan remains appropriate Follow Up Recommendations: Home health PT Equipment Recommended: None recommended by PT PT Goals  Acute Rehab PT Goals PT Goal Formulation: With patient Pt will go Supine/Side to Sit: with supervision PT Goal: Supine/Side to Sit - Progress: Progressing toward goal Pt will go Sit to Supine/Side: with supervision PT Goal: Sit to Supine/Side - Progress: Progressing toward goal Pt will go Sit to Stand: with supervision PT Goal: Sit to Stand - Progress: Progressing toward goal Pt will go Stand to Sit: with supervision PT Goal: Stand to Sit - Progress: Progressing toward goal Pt will Ambulate: >150 feet;with supervision;with rolling walker PT Goal: Ambulate - Progress: Progressing toward goal Pt will Perform Home Exercise Program: with supervision, verbal cues required/provided PT Goal: Perform Home Exercise Program - Progress: Progressing toward goal  PT Treatment Precautions/Restrictions  Precautions Precautions: Posterior Hip Precaution Comments: Pt recalls 3/3 THP Required Braces or Orthoses: No Restrictions Weight Bearing Restrictions: Yes RLE Weight Bearing: Partial weight bearing RLE Partial Weight Bearing Percentage or Pounds: Pt aware he is PWB Mobility (including Balance) Bed Mobility Bed Mobility: Yes Sit to Supine: 4: Min assist Sit to Supine - Details (indicate cue type and reason): demon and instructed pt how to use a bed sheet to assist R LE up onto bed Transfers Transfers: Yes Sit to Stand: From chair/3-in-1 (MinGuard Assist) Sit to Stand Details  (indicate cue type and reason): <25% VC's on proper tech to avoid hip flex > 90' Stand to Sit: To bed (MinGuard Assist) Stand to Sit Details: increased time and one VC to advance R LE prior to sit Ambulation/Gait Ambulation/Gait: Yes Ambulation/Gait Assistance: 5: Supervision Ambulation/Gait Assistance Details (indicate cue type and reason): increased time and 25% VC's to increase R hell strike and safety with turns Ambulation Distance (Feet): 275 Feet Assistive device: Rolling walker Gait Pattern: Step-to pattern;Decreased stance time - right Stairs: No Wheelchair Mobility Wheelchair Mobility: No    Exercise  Total Joint Exercises Ankle Circles/Pumps: AROM;Both;10 reps;Supine Quad Sets: AROM;Both;10 reps;Supine Gluteal Sets: AROM;Both;10 reps;Supine Towel Squeeze: Supine Short Arc Quad: AROM;Right;10 reps;Supine Heel Slides: AAROM;Right;10 reps;Supine Hip ABduction/ADduction: AAROM;Right;10 reps;Supine Knee Flexion: AROM;Right;10 reps;Seated Marching in Standing: AROM;Right;10 reps;Standing Standing Hip Extension: AROM;Right;10 reps;Standing End of Session PT - End of Session Equipment Utilized During Treatment: Gait belt Activity Tolerance: Patient tolerated treatment well;Patient limited by pain Patient left: in bed;with call bell in reach (ice to R hip) General Behavior During Session: [facility] for tasks performed Cognition: [facility] for tasks performed  Felecia Shelling  PTA [facility]  Acute  Rehab Pager     [phone]

## 2011-08-31 NOTE — Progress Notes (Signed)
  CARE MANAGEMENT NOTE 08/31/2011  Patient:  Timothy Wise, Timothy Wise   Account Number:  [id number]  Date Initiated:  08/30/2011  Documentation initiated by:  CRAFT,TERRI  Subjective/Objective Assessment:   56 yo male admitted 08/29/11 with avacular necrosis of right hip S/P Right hip replacement     Action/Plan:   D/C when medcially stable   Anticipated DC Date:  08/31/2011   Anticipated DC Plan:  HOME W HOME HEALTH SERVICES      DC Planning Services  CM consult      [facility] Choice  HOME HEALTH   Choice offered to / List presented to:  C-1 Patient   DME arranged  3-N-1        HH arranged  HH-2 PT      [facility] agency  [facility]   Status of service:  Completed, signed off Medicare Important Message given?   (If response is "NO", the following Medicare IM given date fields will be blank) Date Medicare IM given:   Date Additional Medicare IM given:    Discharge Disposition:  HOME W HOME HEALTH SERVICES  Per UR Regulation:    Comments:  08/31/2011 pt discharged today with [facility] services in place. Start of care tomorrow 09/01/11/Jahnay Lantier rn ccm  08/30/11, Kathi Der RNC-MNN, BSN, [phone], CM received referral for [facility] services.  CM met with pt.  Pt has chosen [place] for [employer].  He states that he has a walker and cane at home.  He states that his wife will be able to assist him at home as needed.

## 2011-08-31 NOTE — Discharge Instructions (Signed)
Pick up stool softner and laxative for home. Do not submerge incision under water. May shower. Continue to use ice for pain and swelling from surgery. Hip precautions.  Total Hip Protocol. 

## 2011-08-31 NOTE — Evaluation (Signed)
Occupational Therapy Evaluation Patient Details Name: Timothy Wise MRN: [id number] DOB: 1956-03-31 Today's Date: 08/31/2011  Problem List:  Patient Active Problem List  Diagnoses  . HYPOTHYROIDISM  . HYPERTENSION  . ALLERGIC RHINITIS  . GERD  . KNEE PAIN  . SUBACROMIAL BURSITIS  . PERONEAL NEUROPATHY  . Postop Hyponatremia    Past Medical History:  Past Medical History  Diagnosis Date  . Allergic rhinitis   . GERD (gastroesophageal reflux disease)   . Hypertension   . Hypothyroidism   . Hematuria     benign (had negative work up per Dr. Ernestine Conrad in [facility])  . Thumb fracture   . Arthritis 08-22-11    Rt. hip avascular necrosis- surgery planned.   Past Surgical History:  Past Surgical History  Procedure Date  . Colonoscopy 08-13-08    per Dr. 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  He is appropriate for skilled OT to reinforced THPS with ADLs for safe transition home. OT Recommendation/Assessment: Patient will need skilled OT in the acute care venue OT Problem List: Decreased strength;Pain;Decreased knowledge of use of DME or AE;Decreased knowledge of precautions;Decreased activity tolerance OT Therapy Diagnosis : Generalized weakness OT Plan OT Frequency: Min 2X/week OT Treatment/Interventions: Self-care/ADL training;DME and/or AE instruction;Patient/family education OT Recommendation Follow Up Recommendations: Home health OT Equipment Recommended: 3:1 was delivered to room; pt is considering tub transfer bench Individuals Consulted Consulted and Agree with Results and Recommendations: Patient OT Goals Acute Rehab OT Goals OT Goal Formulation: With patient Time For Goal Achievement: 7 days ADL Goals Pt Will Perform Grooming: with supervision;Standing at sink ADL Goal: Grooming - Progress: Goal set today Pt Will Transfer to  Toilet: with supervision;Ambulation;3-in-1 (all aspects) ADL Goal: Toilet Transfer - Progress: Goal set today Pt Will Perform Tub/Shower Transfer: Tub transfer;Transfer tub bench;with min assist;Ambulation ADL Goal: Tub/Shower Transfer - Progress: Goal set today Miscellaneous OT Goals Miscellaneous OT Goal #1: Pt will verbalize application of  precautions, identifying internal rotation to attend to, and state that sock aid needs to be positioned laterally to RLE when asked OT Goal: Miscellaneous Goal #1 - Progress: Goal set today  OT Evaluation Precautions/Restrictions  Precautions Precautions: Posterior Hip Precaution Comments: Pt recalls 3/3 THP Required Braces or Orthoses: No Restrictions Weight Bearing Restrictions: Yes RLE Weight Bearing: Partial weight bearing RLE Partial Weight Bearing Percentage or Pounds: Pt aware he is PWB Prior Functioning Home Living Bathroom Shower/Tub: Tub/shower unit Bathroom Toilet: Standard   ADL ADL Grooming: Simulated;Set up Where Assessed - Grooming: Sitting, chair;Supported Upper Body Bathing: Simulated;Supervision/safety Where Assessed - Upper Body Bathing: Sitting, chair;Supported Lower Body Bathing: Simulated;Moderate assistance Where Assessed - Lower Body Bathing: Sit to stand from chair Upper Body Dressing: Simulated;Supervision/safety Where Assessed - Upper Body Dressing: Sitting, chair;Supported Lower Body Dressing: Performed;Moderate assistance (pants with reacher:  unable to lift RLE) Lower Body Dressing Details (indicate cue type and reason): min cues for internal rotation Where Assessed - Lower Body Dressing: Sit to stand from chair Toilet Transfer: Simulated;Minimal assistance (bed to chair) Toilet Transfer Details (indicate cue type and reason): min vcs for internal rotation Toilet Transfer Method: Stand pivot Toilet Transfer Equipment:  Nurse, children's) Toileting - Clothing Manipulation: Simulated;Minimal assistance (min  guard) Toileting - Clothing Manipulation Details (indicate cue type and reason): min cues for internal rotation Where Assessed - Toileting Clothing Manipulation: Standing Toileting - Hygiene: Simulated;Minimal assistance (min  guard) Toileting - Hygiene Details (indicate cue type and reason): min vcs for internal rotation Where Assessed - Toileting Hygiene: Standing Equipment Used: Reacher Ambulation Related to ADLs:  (pt internally rotates RLE--cues to keep in neutral) ADL Comments:  (Pt has AE kit; reviewed use and practiced with reacher. Educated to toss sock aid out to side for RLE.  Pt cannot lift RLE enough to use yet  ) Vision/Perception  Vision - History Baseline Vision: No visual deficits Cognition Cognition Overall Cognitive Status: Appears within functional limits for tasks assessed Cognition - Other Comments: needs reinforcement with THPS Sensation/Coordination   Extremity Assessment RUE Assessment RUE Assessment: Within Functional Limits LUE Assessment LUE Assessment: Within Functional Limits Mobility  Bed Mobility Bed Mobility: Yes Supine to Sit: 4: Min assist Supine to Sit Details (indicate cue type and reason): min vcs Sit to Supine: 4: Min assist Sit to Supine - Details (indicate cue type and reason): demon and instructed pt how to use a bed sheet to assist R LE up onto bed Transfers Sit to Stand: From chair/3-in-1 (MinGuard Assist) Sit to Stand Details (indicate cue type and reason): <25% VC's on proper tech to avoid hip flex > 90' Stand to Sit: To bed (MinGuard Assist) Stand to Sit Details: increased time and one VC to advance R LE prior to sit Exercises End of Session OT - End of Session Activity Tolerance: Patient limited by pain Patient left: in chair;with call bell in reach General Behavior During Session: [facility] for tasks performed Cognition: [facility] for tasks performed [facility], OTR/L [phone] 08/31/2011  Timothy Wise 08/31/2011, 1:33 PM

## 2011-09-17 ENCOUNTER — Encounter (HOSPITAL_COMMUNITY): Payer: Self-pay | Admitting: Orthopedic Surgery

## 2011-12-24 ENCOUNTER — Telehealth: Payer: Self-pay | Admitting: Family Medicine

## 2011-12-24 MED ORDER — LOSARTAN POTASSIUM-HCTZ 100-12.5 MG PO TABS: 1.0000 | ORAL_TABLET | Freq: Every day | ORAL | Status: DC

## 2011-12-24 NOTE — Telephone Encounter (Signed)
Pt took last pill of losartan-hydrochlorothiazide (HYZAAR) 100-12.5 MG per tablet this morning. Pt is req to get a partial refill called in to local [facility] on [address] in [place] today and would also req a 90 day supply with refill called in to [facility] [phone]. Pt req to be notified when these have been called in.

## 2011-12-24 NOTE — Telephone Encounter (Signed)
I sent in a 30 day supply to [facility] and a 90 day supply to [facility], e-scribe.

## 2011-12-24 NOTE — Telephone Encounter (Signed)
I called and left voice message

## 2012-01-15 ENCOUNTER — Telehealth: Payer: Self-pay | Admitting: Family Medicine

## 2012-01-15 NOTE — Telephone Encounter (Signed)
Pt lost rx for lostortan 100-12.5mg . [facility] is sending over a refill request for medication pt is out of medication and is requesting a 2 week supply be called into local pharmacy   [facility]  [address]

## 2012-01-15 NOTE — Telephone Encounter (Signed)
I called in a 2 week supply of medication and left a voice message for pt.

## 2012-01-23 ENCOUNTER — Telehealth: Payer: Self-pay | Admitting: Family Medicine

## 2012-01-23 NOTE — Telephone Encounter (Signed)
Pt called and said that he rcvd his 2 wk script for Losartan, but needs his 90 day supply with refills sent to [facility] mail order pharmacy asap.

## 2012-01-23 NOTE — Telephone Encounter (Signed)
I printed and faxed the script.

## 2012-01-25 ENCOUNTER — Telehealth: Payer: Self-pay | Admitting: Family Medicine

## 2012-01-25 MED ORDER — LOSARTAN POTASSIUM-HCTZ 100-12.5 MG PO TABS: 1.0000 | ORAL_TABLET | Freq: Every day | ORAL | Status: DC

## 2012-01-25 NOTE — Telephone Encounter (Signed)
I resent script e-scribe for Hyzaar to [facility].

## 2012-01-31 ENCOUNTER — Telehealth: Payer: Self-pay | Admitting: Family Medicine

## 2012-01-31 NOTE — Telephone Encounter (Signed)
Patient called stating that he need a 30 day refill of losartan called into [facility] [address] in [place]. Please assist.

## 2012-02-01 NOTE — Telephone Encounter (Signed)
I did phone in the 30 day supply to [facility]. ( looks like the pt is waiting on a 90 day supply order )

## 2012-02-05 ENCOUNTER — Telehealth: Payer: Self-pay | Admitting: Family Medicine

## 2012-02-05 MED ORDER — LEVOTHYROXINE SODIUM 25 MCG PO TABS: 25.0000 ug | ORAL_TABLET | Freq: Every day | ORAL | Status: DC

## 2012-02-05 NOTE — Telephone Encounter (Signed)
Patient called stating that he need a refill of his levothyroxine sent to [facility]. Please assist.

## 2012-02-05 NOTE — Telephone Encounter (Signed)
I sent script e-scribe. 

## 2012-02-12 ENCOUNTER — Telehealth: Payer: Self-pay | Admitting: Family Medicine

## 2012-02-12 MED ORDER — LEVOTHYROXINE SODIUM 25 MCG PO TABS: 25.0000 ug | ORAL_TABLET | Freq: Every day | ORAL | Status: DC

## 2012-02-12 NOTE — Telephone Encounter (Signed)
I resent script e-scribe. 

## 2012-02-12 NOTE — Telephone Encounter (Signed)
Please re-send script for levothyroxine (SYNTHROID, LEVOTHROID) 25 MCG tablet  To [facility] pt states that they did not receive script

## 2012-02-13 ENCOUNTER — Telehealth: Payer: Self-pay | Admitting: Family Medicine

## 2012-02-13 MED ORDER — LEVOTHYROXINE SODIUM 25 MCG PO TABS: 25.0000 ug | ORAL_TABLET | Freq: Every day | ORAL | Status: DC

## 2012-02-13 NOTE — Telephone Encounter (Signed)
Please fax the rx for levothyroxine to [facility]. Phone [phone] and fax [phone].

## 2012-02-13 NOTE — Telephone Encounter (Signed)
I printed script and faxed to the below number.

## 2012-02-15 ENCOUNTER — Telehealth: Payer: Self-pay | Admitting: Family Medicine

## 2012-02-15 MED ORDER — LOSARTAN POTASSIUM-HCTZ 100-12.5 MG PO TABS: 1.0000 | ORAL_TABLET | Freq: Every day | ORAL | Status: DC

## 2012-02-15 NOTE — Telephone Encounter (Signed)
I had to resend script for Losartan/HCT to [facility].

## 2012-03-03 ENCOUNTER — Telehealth: Payer: Self-pay | Admitting: Family Medicine

## 2012-03-03 NOTE — Telephone Encounter (Signed)
Patient called stating that he need a refill of his losartan called into [facility] and he will need at least 2 [facility] [address] in [place] ph [phone]. Please assist. The [facility] will be paid out of pocket.

## 2012-03-03 NOTE — Telephone Encounter (Signed)
Can we refill this? 

## 2012-03-04 MED ORDER — LOSARTAN POTASSIUM-HCTZ 100-12.5 MG PO TABS: 1.0000 | ORAL_TABLET | Freq: Every day | ORAL | Status: DC

## 2012-03-04 NOTE — Telephone Encounter (Signed)
I sent script e-scribe to [facility] and also a 30 day supply to local pharmacy, left voice message.

## 2012-03-04 NOTE — Telephone Encounter (Signed)
Yes please take care of both of these

## 2012-03-24 ENCOUNTER — Other Ambulatory Visit (INDEPENDENT_AMBULATORY_CARE_PROVIDER_SITE_OTHER): Payer: BC Managed Care – PPO

## 2012-03-24 DIAGNOSIS — Z13220 Encounter for screening for lipoid disorders: Secondary | ICD-10-CM

## 2012-03-24 DIAGNOSIS — Z0000 Encounter for general adult medical examination without abnormal findings: Secondary | ICD-10-CM

## 2012-03-24 LAB — POCT URINALYSIS DIPSTICK
Ketones, UA: NEGATIVE
Leukocytes, UA: NEGATIVE
Protein, UA: NEGATIVE
Urobilinogen, UA: 0.2

## 2012-03-24 LAB — HEPATIC FUNCTION PANEL
Albumin: 4.1 g/dL (ref 3.5–5.2)
Total Bilirubin: 0.9 mg/dL (ref 0.3–1.2)

## 2012-03-24 LAB — CBC WITH DIFFERENTIAL/PLATELET
Basophils Absolute: 0 10*3/uL (ref 0.0–0.1)
Eosinophils Absolute: 0.1 10*3/uL (ref 0.0–0.7)
HCT: 45.8 % (ref 39.0–52.0)
Hemoglobin: 15.2 g/dL (ref 13.0–17.0)
Lymphs Abs: 1.9 10*3/uL (ref 0.7–4.0)
MCHC: 33.1 g/dL (ref 30.0–36.0)
Monocytes Absolute: 0.6 10*3/uL (ref 0.1–1.0)
Neutro Abs: 4.5 10*3/uL (ref 1.4–7.7)
Platelets: 236 10*3/uL (ref 150.0–400.0)
RDW: 13.5 % (ref 11.5–14.6)

## 2012-03-24 LAB — LIPID PANEL
Cholesterol: 233 mg/dL — ABNORMAL HIGH (ref 0–200)
VLDL: 21.6 mg/dL (ref 0.0–40.0)

## 2012-03-24 LAB — BASIC METABOLIC PANEL
BUN: 17 mg/dL (ref 6–23)
CO2: 27 mEq/L (ref 19–32)
Glucose, Bld: 96 mg/dL (ref 70–99)
Potassium: 4.3 mEq/L (ref 3.5–5.1)
Sodium: 138 mEq/L (ref 135–145)

## 2012-03-24 LAB — TSH: TSH: 3.08 u[IU]/mL (ref 0.35–5.50)

## 2012-03-28 NOTE — Progress Notes (Signed)
Quick Note:  I left voice message with results. ______ 

## 2012-03-31 ENCOUNTER — Encounter: Payer: Self-pay | Admitting: Family Medicine

## 2012-03-31 ENCOUNTER — Ambulatory Visit (INDEPENDENT_AMBULATORY_CARE_PROVIDER_SITE_OTHER): Payer: BC Managed Care – PPO | Admitting: Family Medicine

## 2012-03-31 VITALS — BP 124/76 | HR 74 | Temp 97.7°F | Ht 74.5 in | Wt 250.0 lb

## 2012-03-31 DIAGNOSIS — Z0000 Encounter for general adult medical examination without abnormal findings: Secondary | ICD-10-CM

## 2012-03-31 MED ORDER — LOSARTAN POTASSIUM-HCTZ 100-12.5 MG PO TABS: 1.0000 | ORAL_TABLET | Freq: Every day | ORAL | Status: DC

## 2012-03-31 MED ORDER — LEVOTHYROXINE SODIUM 25 MCG PO TABS: 25.0000 ug | ORAL_TABLET | Freq: Every day | ORAL | Status: DC

## 2012-03-31 MED ORDER — OMEPRAZOLE 40 MG PO CPDR: 40.0000 mg | DELAYED_RELEASE_CAPSULE | Freq: Every day | ORAL | Status: DC

## 2012-03-31 NOTE — Progress Notes (Signed)
  Subjective:    Patient ID: Timothy Wise, male    DOB: December 24, 1955, 56 y.o.   MRN: [id number]  HPI 56 yr old male for a cpx. He feels well and has no concerns. He did well with his hip replacement surgery.    Review of Systems  Constitutional: Negative.   HENT: Negative.   Eyes: Negative.   Respiratory: Negative.   Cardiovascular: Negative.   Gastrointestinal: Negative.   Genitourinary: Negative.   Musculoskeletal: Negative.   Skin: Negative.   Neurological: Negative.   Hematological: Negative.   Psychiatric/Behavioral: Negative.        Objective:   Physical Exam  Constitutional: He is oriented to person, place, and time. He appears well-developed and well-nourished. No distress.  HENT:  Head: Normocephalic and atraumatic.  Right Ear: External ear normal.  Left Ear: External ear normal.  Nose: Nose normal.  Mouth/Throat: Oropharynx is clear and moist. No oropharyngeal exudate.  Eyes: Conjunctivae normal and EOM are normal. Pupils are equal, round, and reactive to light. Right eye exhibits no discharge. Left eye exhibits no discharge. No scleral icterus.  Neck: Neck supple. No JVD present. No tracheal deviation present. No thyromegaly present.  Cardiovascular: Normal rate, regular rhythm, normal heart sounds and intact distal pulses.  Exam reveals no gallop and no friction rub.   No murmur heard. Pulmonary/Chest: Effort normal and breath sounds normal. No respiratory distress. He has no wheezes. He has no rales. He exhibits no tenderness.  Abdominal: Soft. Bowel sounds are normal. He exhibits no distension and no mass. There is no tenderness. There is no rebound and no guarding.  Genitourinary: Rectum normal, prostate normal and penis normal. Guaiac negative stool. No penile tenderness.  Musculoskeletal: Normal range of motion. He exhibits no edema and no tenderness.  Lymphadenopathy:    He has no cervical adenopathy.  Neurological: He is alert and oriented to person, place,  and time. He has normal reflexes. No cranial nerve deficit. He exhibits normal muscle tone. Coordination normal.  Skin: Skin is warm and dry. No rash noted. He is not diaphoretic. No erythema. No pallor.  Psychiatric: He has a normal mood and affect. His behavior is normal. Judgment and thought content normal.          Assessment & Plan:  Well exam.

## 2012-05-14 DIAGNOSIS — Z0279 Encounter for issue of other medical certificate: Secondary | ICD-10-CM

## 2012-08-13 ENCOUNTER — Encounter: Payer: Self-pay | Admitting: Family Medicine

## 2012-08-13 ENCOUNTER — Ambulatory Visit (INDEPENDENT_AMBULATORY_CARE_PROVIDER_SITE_OTHER): Payer: BC Managed Care – PPO | Admitting: Family Medicine

## 2012-08-13 VITALS — BP 138/86 | HR 77 | Temp 97.4°F | Wt 250.0 lb

## 2012-08-13 DIAGNOSIS — L723 Sebaceous cyst: Secondary | ICD-10-CM

## 2012-08-13 NOTE — Progress Notes (Signed)
  Subjective:    Patient ID: Timothy Wise, male    DOB: June 23, 1956, 57 y.o.   MRN: [id number]  HPI Here for a pimple like lesion that appeared beside the left eye several weeks ago. It is slowly enlarging but is not painful.    Review of Systems  Constitutional: Negative.   HENT: Negative.   Eyes: Negative.        Objective:   Physical Exam  Constitutional: He appears well-developed and well-nourished.  HENT:       There is a cystic lesion just lateral to the left eye, it is not tender          Assessment & Plan:  This is a sebaceous cyst. It was cleansed with an alcohol swab and it was lanced with a needle. Some sebaceous material was extracted. He will recheck prn

## 2013-03-17 IMAGING — CR DG PORTABLE PELVIS
1 series · 3 of 3 positions shown · non-contrast
Comparison: None.

CLINICAL DATA: Hip replacement.

PORTABLE PELVIS

[Series 1: AP · U · 3 of 3 slices shown]
[im 1/3]
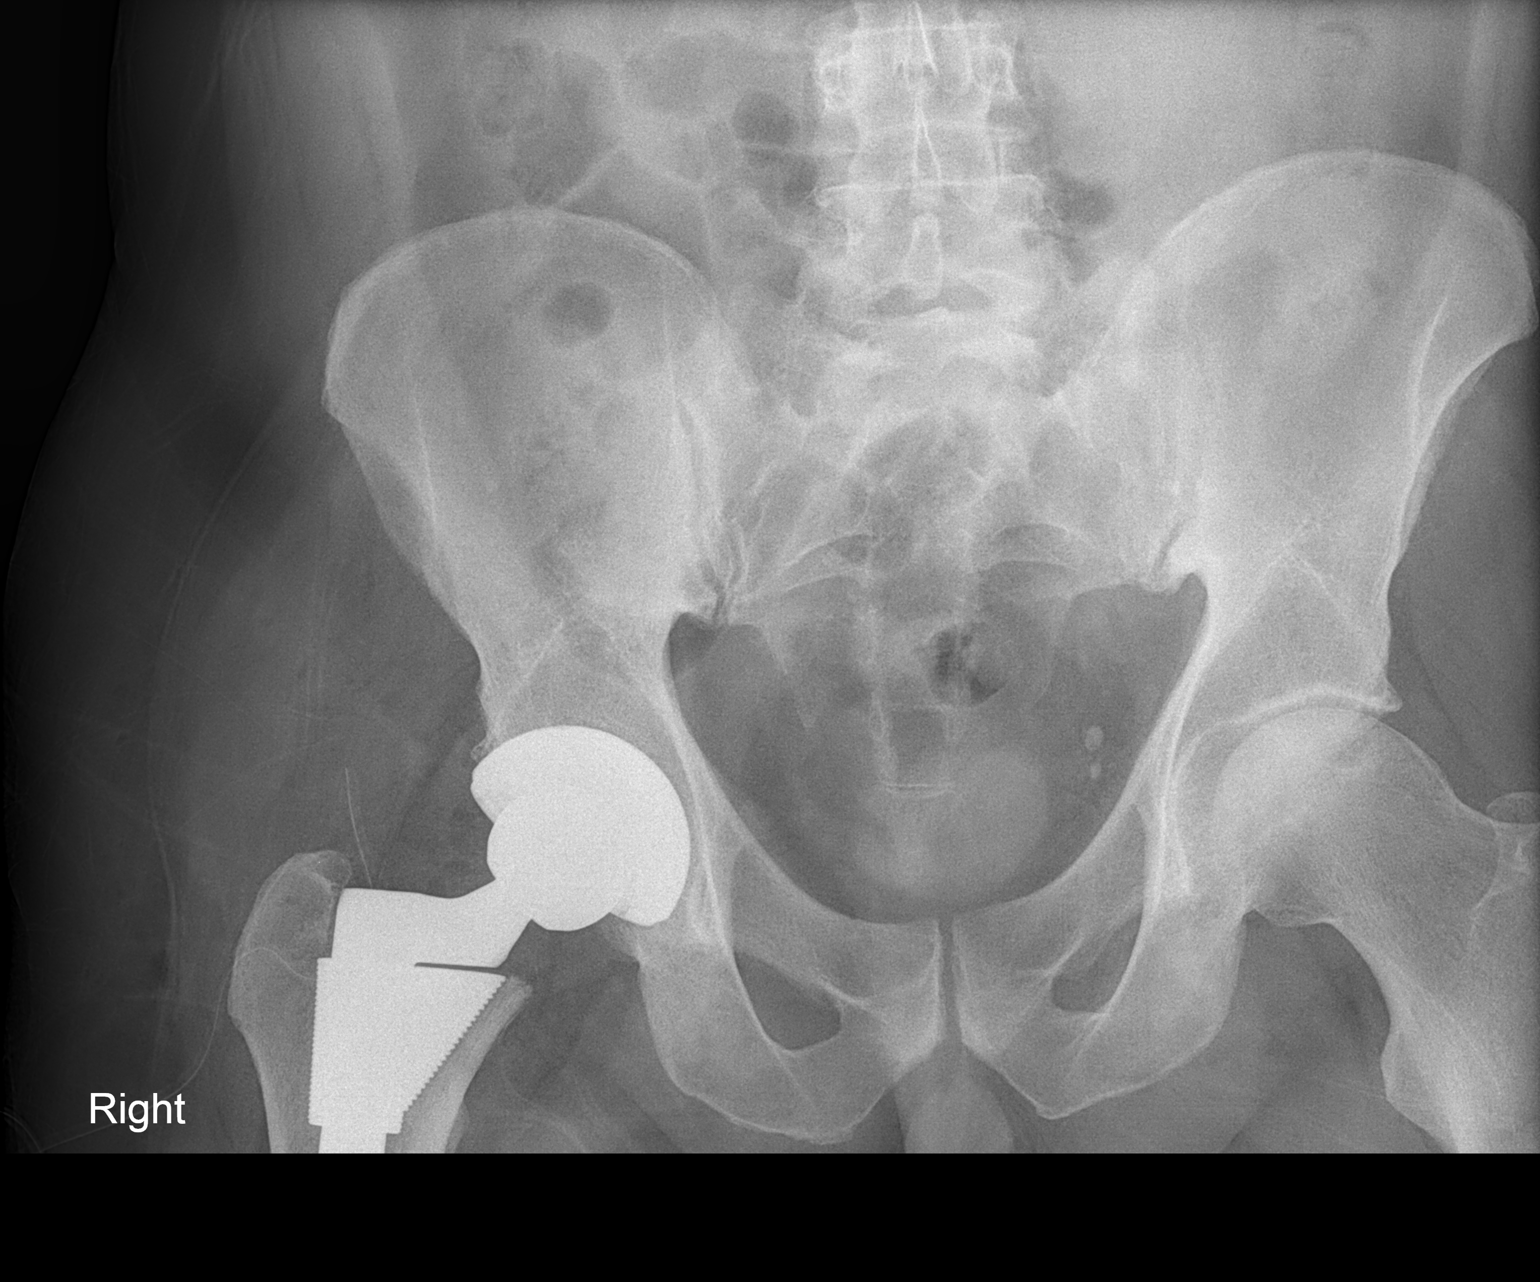
[im 2/3]
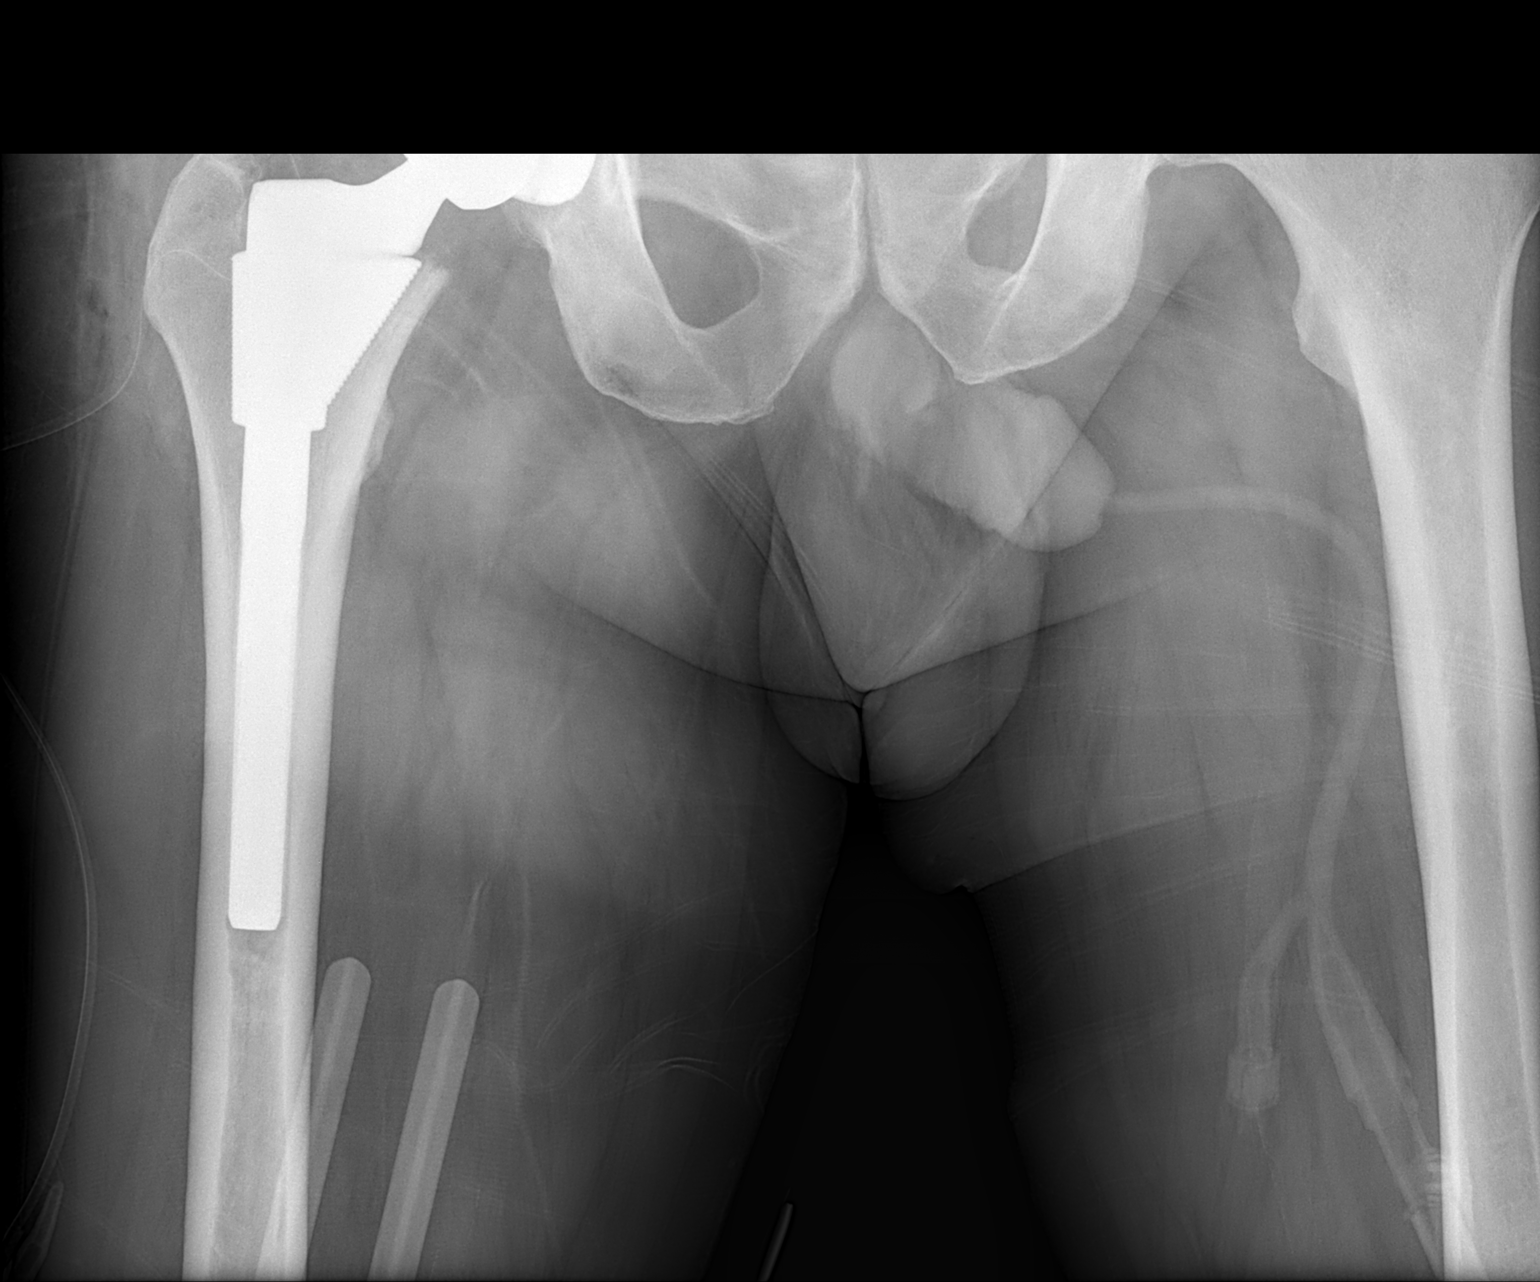
[im 3/3]
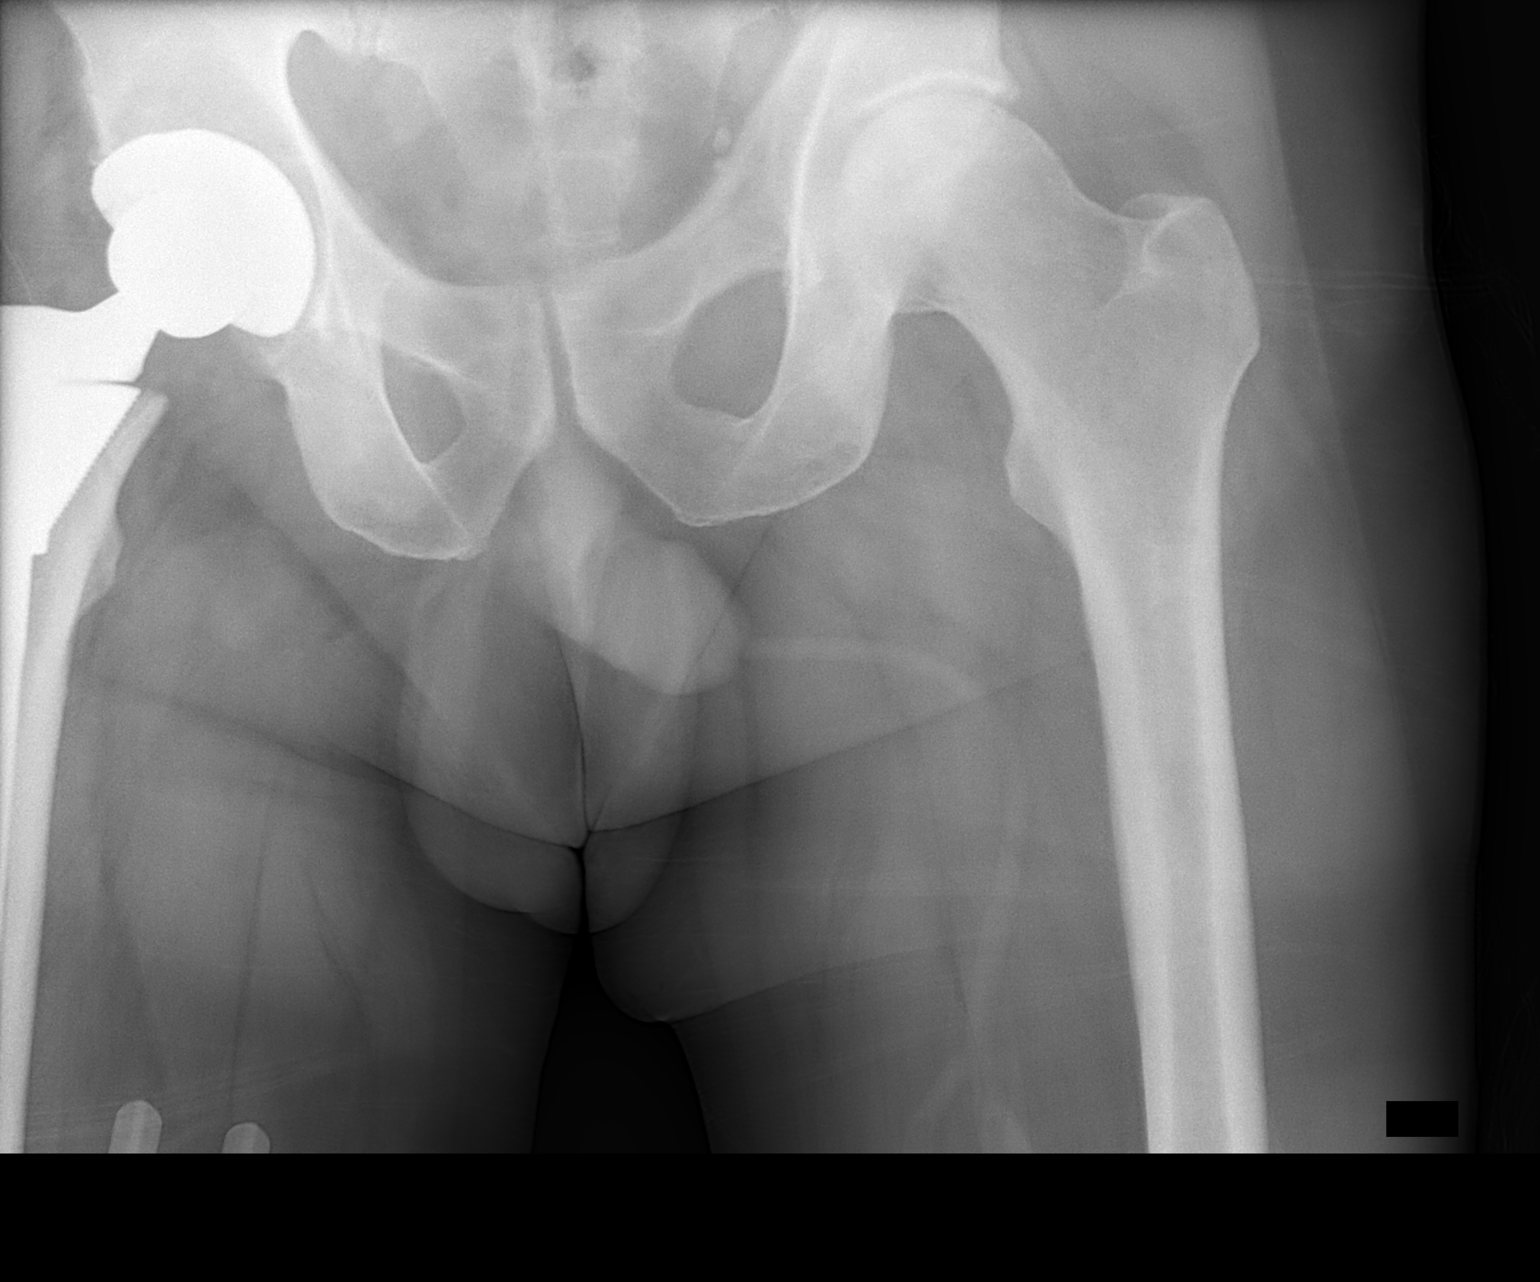

[3 of 3 positions shown; findings below may reference images not displayed]

FINDINGS: The patient has a new right total hip replacement.
Surgical drain and gas in the soft tissues are noted.  The device
is located and there is no fracture.  Left hip degenerative disease
is identified.
IMPRESSION: Right total hip without evidence of complication.

## 2013-04-27 ENCOUNTER — Telehealth: Payer: Self-pay | Admitting: Family Medicine

## 2013-04-27 NOTE — Telephone Encounter (Signed)
Patient Information:  Caller Name: Dhanvin  Phone: [phone]  Patient: Timothy, Wise  Gender: Male  DOB: [birth year]  Age: 57 Years  PCP: Gershon Cr[facility])  Office Follow Up:  Does the office need to follow up with this patient?: No  Instructions For The Office: N/A  RN Note:  Patient states he is planning on starting a weight loss program at the [facility] and [facility] in approx. 2 weeks. Patient states he was advised that his blood pressure medication may need to be adjusted as he loses weight. Patient states he is calling to inform Dr.Fry that he is going to begin the program. Patient advised to monitor his blood pressure daily. Call back parameters reviewed. Patient has not had a well visit for >1 year. Call transferred to [facility], in office, to schedule routine appt.  Symptoms  Reason For Call & Symptoms: Questions regarding blood pressure and weight loss program  Reviewed Health History In EMR: Yes  Reviewed Medications In EMR: Yes  Reviewed Allergies In EMR: Yes  Reviewed Surgeries / Procedures: Yes  Date of Onset of Symptoms: 04/27/2013  Guideline(s) Used:  No Protocol Available - Information Only  Disposition Per Guideline:   Home Care  Reason For Disposition Reached:   Information only question and nurse able to answer  Advice Given:  Call Back If:  New symptoms develop  Patient Will Follow Care Advice:  YES

## 2013-05-14 ENCOUNTER — Other Ambulatory Visit (INDEPENDENT_AMBULATORY_CARE_PROVIDER_SITE_OTHER): Payer: BC Managed Care – PPO

## 2013-05-14 DIAGNOSIS — Z0000 Encounter for general adult medical examination without abnormal findings: Secondary | ICD-10-CM

## 2013-05-14 LAB — CBC WITH DIFFERENTIAL/PLATELET
Basophils Relative: 0.4 % (ref 0.0–3.0)
Eosinophils Absolute: 0.2 10*3/uL (ref 0.0–0.7)
Hemoglobin: 15.7 g/dL (ref 13.0–17.0)
Lymphs Abs: 1.9 10*3/uL (ref 0.7–4.0)
MCHC: 34.4 g/dL (ref 30.0–36.0)
MCV: 94.6 fl (ref 78.0–100.0)
Monocytes Absolute: 0.5 10*3/uL (ref 0.1–1.0)
Neutro Abs: 3.1 10*3/uL (ref 1.4–7.7)
RBC: 4.82 Mil/uL (ref 4.22–5.81)

## 2013-05-14 LAB — POCT URINALYSIS DIPSTICK
Glucose, UA: NEGATIVE
Leukocytes, UA: NEGATIVE
Nitrite, UA: NEGATIVE
Urobilinogen, UA: 0.2
pH, UA: 7

## 2013-05-14 LAB — LIPID PANEL
Cholesterol: 252 mg/dL — ABNORMAL HIGH (ref 0–200)
Total CHOL/HDL Ratio: 4

## 2013-05-14 LAB — LDL CHOLESTEROL, DIRECT: Direct LDL: 170.8 mg/dL

## 2013-05-14 LAB — BASIC METABOLIC PANEL
BUN: 18 mg/dL (ref 6–23)
Creatinine, Ser: 1 mg/dL (ref 0.4–1.5)
GFR: 79.41 mL/min (ref >60.00)

## 2013-05-14 LAB — HEPATIC FUNCTION PANEL
ALT: 21 U/L (ref 0–53)
AST: 23 U/L (ref 0–37)
Albumin: 4.2 g/dL (ref 3.5–5.2)
Bilirubin, Direct: 0.1 mg/dL (ref 0.0–0.3)
Total Bilirubin: 1.1 mg/dL (ref 0.3–1.2)

## 2013-05-14 LAB — TSH: TSH: 4.19 u[IU]/mL (ref 0.35–5.50)

## 2013-05-15 NOTE — Progress Notes (Signed)
Quick Note:  Pt has appointment on 05/27/13 will go over then. ______

## 2013-05-27 ENCOUNTER — Ambulatory Visit (INDEPENDENT_AMBULATORY_CARE_PROVIDER_SITE_OTHER): Payer: BC Managed Care – PPO | Admitting: Family Medicine

## 2013-05-27 ENCOUNTER — Encounter: Payer: Self-pay | Admitting: Family Medicine

## 2013-05-27 VITALS — BP 120/80 | HR 84 | Temp 97.8°F | Ht 74.0 in | Wt 247.0 lb

## 2013-05-27 DIAGNOSIS — Z0000 Encounter for general adult medical examination without abnormal findings: Secondary | ICD-10-CM

## 2013-05-27 MED ORDER — LEVOTHYROXINE SODIUM 25 MCG PO TABS: 25.0000 ug | ORAL_TABLET | Freq: Every day | ORAL | Status: DC

## 2013-05-27 MED ORDER — OMEPRAZOLE 40 MG PO CPDR: 40.0000 mg | DELAYED_RELEASE_CAPSULE | Freq: Every day | ORAL | Status: AC

## 2013-05-27 MED ORDER — LOSARTAN POTASSIUM-HCTZ 100-12.5 MG PO TABS: 1.0000 | ORAL_TABLET | Freq: Every day | ORAL | Status: DC

## 2013-05-27 NOTE — Progress Notes (Signed)
Pre visit review using our clinic review tool, if applicable. No additional management support is needed unless otherwise documented below in the visit note. 

## 2013-05-27 NOTE — Progress Notes (Signed)
  Subjective:    Patient ID: Timothy Wise, male    DOB: 04-Jan-1956, 57 y.o.   MRN: [id number]  HPI 57 yr old male for a cpx. He feels well.    Review of Systems  Constitutional: Negative.   HENT: Negative.   Eyes: Negative.   Respiratory: Negative.   Cardiovascular: Negative.   Gastrointestinal: Negative.   Genitourinary: Negative.   Musculoskeletal: Negative.   Skin: Negative.   Neurological: Negative.   Psychiatric/Behavioral: Negative.        Objective:   Physical Exam  Constitutional: He is oriented to person, place, and time. He appears well-developed and well-nourished. No distress.  HENT:  Head: Normocephalic and atraumatic.  Right Ear: External ear normal.  Left Ear: External ear normal.  Nose: Nose normal.  Mouth/Throat: Oropharynx is clear and moist. No oropharyngeal exudate.  Eyes: Conjunctivae and EOM are normal. Pupils are equal, round, and reactive to light. Right eye exhibits no discharge. Left eye exhibits no discharge. No scleral icterus.  Neck: Neck supple. No JVD present. No tracheal deviation present. No thyromegaly present.  Cardiovascular: Normal rate, regular rhythm, normal heart sounds and intact distal pulses.  Exam reveals no gallop and no friction rub.   No murmur heard. EKG normal  Pulmonary/Chest: Effort normal and breath sounds normal. No respiratory distress. He has no wheezes. He has no rales. He exhibits no tenderness.  Abdominal: Soft. Bowel sounds are normal. He exhibits no distension and no mass. There is no tenderness. There is no rebound and no guarding.  Genitourinary: Rectum normal, prostate normal and penis normal. Guaiac negative stool. No penile tenderness.  Musculoskeletal: Normal range of motion. He exhibits no edema and no tenderness.  Lymphadenopathy:    He has no cervical adenopathy.  Neurological: He is alert and oriented to person, place, and time. He has normal reflexes. No cranial nerve deficit. He exhibits normal muscle  tone. Coordination normal.  Skin: Skin is warm and dry. No rash noted. He is not diaphoretic. No erythema. No pallor.  Psychiatric: He has a normal mood and affect. His behavior is normal. Judgment and thought content normal.          Assessment & Plan:  Well exam.

## 2014-03-23 ENCOUNTER — Ambulatory Visit (INDEPENDENT_AMBULATORY_CARE_PROVIDER_SITE_OTHER): Payer: BC Managed Care – PPO | Admitting: Physician Assistant

## 2014-03-23 ENCOUNTER — Encounter: Payer: Self-pay | Admitting: Physician Assistant

## 2014-03-23 VITALS — BP 124/72 | HR 60 | Temp 98.0°F | Resp 18 | Wt 222.9 lb

## 2014-03-23 DIAGNOSIS — S61209A Unspecified open wound of unspecified finger without damage to nail, initial encounter: Secondary | ICD-10-CM

## 2014-03-23 DIAGNOSIS — S61219A Laceration without foreign body of unspecified finger without damage to nail, initial encounter: Secondary | ICD-10-CM

## 2014-03-23 DIAGNOSIS — Z23 Encounter for immunization: Secondary | ICD-10-CM

## 2014-03-23 NOTE — Patient Instructions (Addendum)
Daily dressing changes with Bacitracin ointment and covered with a plain bandaid. You can wash with sterile saline and peroxide for a few days, and then can switch to cleaning with soap and water.   Try to keep the bandaid dry. If it gets wet, change the bandage.  If emergency symptoms discussed during visit developed, seek medical attention immediately.  Followup as needed, or for worsening or persistent symptoms despite treatment.     Wound Care Wound care helps prevent pain and infection.  You may need a tetanus shot if:  You cannot remember when you had your last tetanus shot.  You have never had a tetanus shot.  The injury broke your skin. If you need a tetanus shot and you choose not to have one, you may get tetanus. Sickness from tetanus can be serious. HOME CARE   Only take medicine as told by your doctor.  Clean the wound daily with mild soap and water.  Change any bandages (dressings) as told by your doctor.  Put medicated cream and a bandage on the wound as told by your doctor.  Change the bandage if it gets wet, dirty, or starts to smell.  Take showers. Do not take baths, swim, or do anything that puts your wound under water.  Rest and raise (elevate) the wound until the pain and puffiness (swelling) are better.  Keep all doctor visits as told. GET HELP RIGHT AWAY IF:   Yellowish-white fluid (pus) comes from the wound.  Medicine does not lessen your pain.  There is a red streak going away from the wound.  You have a fever. MAKE SURE YOU:   Understand these instructions.  Will watch your condition.  Will get help right away if you are not doing well or get worse. Document Released: 04/10/2008 Document Revised: 09/24/2011 Document Reviewed: 11/05/2010 [employer] Patient Information 2015 [place], [place]. This information is not intended to replace advice given to you by your health care provider. Make sure you discuss any questions you have with your  health care provider.

## 2014-03-23 NOTE — Progress Notes (Signed)
Subjective:    Patient ID: Timothy Wise, male    DOB: 02-08-1956, 58 y.o.   MRN: [id number]  Laceration  The incident occurred 3 to 5 days ago (3 or 4days). The laceration is located on the left hand (left 2nd finger lateral DIP ). Injury mechanism: Cut with Axe, wasn't particularly dirty, but had been chopping wood. The pain is at a severity of 1/10. The pain is mild. The pain has been improving since onset. He reports no foreign bodies present. His tetanus status is out of date.  Pt has hx of a Right Total hip replacement 3 years ago, and is worried about spreading infection from the finger. He has been cleaning the area with peroxide, and was keeping it covered, until today, when he switched to leaving it open to air.   Review of Systems  Constitutional: Positive for chills (mild, last night only, resolved.). Negative for fever.  Respiratory: Negative for cough and shortness of breath.   Cardiovascular: Negative for chest pain.  Gastrointestinal: Positive for nausea (with chills, resolved.  reports he was a little hungover from weekend.). Negative for vomiting, abdominal pain and diarrhea.  Neurological: Negative for syncope and headaches.  All other systems reviewed and are negative.    Past Medical History  Diagnosis Date  . Allergic rhinitis   . GERD (gastroesophageal reflux disease)   . Hypertension   . Hypothyroidism   . Hematuria     benign (had negative work up per Dr. Ernestine Wise in [facility])  . Thumb fracture   . Arthritis 08-22-11    Rt. hip avascular necrosis- surgery planned.    History   Social History  . Marital Status: Married    Spouse Name: N/A    Number of Children: N/A  . Years of Education: N/A   Occupational History  . Not on file.   Social History Main Topics  . Smoking status: Former Smoker    Quit date: 07/17/2011  . Smokeless tobacco: Former Neurosurgeon  . Alcohol Use: 0.0 oz/week     Comment: occ  . Drug Use: No  . Sexual Activity: Yes    Other Topics Concern  . Not on file   Social History Narrative   Married   Regular exercise- no          Past Surgical History  Procedure Laterality Date  . Colonoscopy  08-13-08    per Dr. Juanda Wise, repeat 10 yrs   . Total hip arthroplasty  08/29/2011    Procedure: TOTAL HIP ARTHROPLASTY;  Surgeon: Timothy Drilling, MD;  Location: [facility];  Service: Orthopedics;  Laterality: Right;    Family History  Problem Relation Age of Onset  . Hyperlipidemia      No Known Allergies  Current Outpatient Prescriptions on File Prior to Visit  Medication Sig Dispense Refill  . levothyroxine (SYNTHROID, LEVOTHROID) 25 MCG tablet Take 1 tablet (25 mcg total) by mouth daily with breakfast.  30 tablet  0  . losartan-hydrochlorothiazide (HYZAAR) 100-12.5 MG per tablet Take 1 tablet by mouth daily with breakfast.  90 tablet  3  . naproxen sodium (ANAPROX) 220 MG tablet Take 220 mg by mouth daily.      Marland Kitchen omeprazole (PRILOSEC) 40 MG capsule Take 1 capsule (40 mg total) by mouth daily.  90 capsule  3   No current facility-administered medications on file prior to visit.    EXAM: BP 124/72  Pulse 60  Temp(Src) 98 F (36.7 C) (Oral)  Resp  18  Wt 222 lb 14.4 oz (101.107 kg)     Objective:   Physical Exam  Nursing note and vitals reviewed. Constitutional: He is oriented to person, place, and time. He appears well-developed and well-nourished. No distress.  HENT:  Head: Normocephalic and atraumatic.  Eyes: Conjunctivae and EOM are normal.  Neck: Normal range of motion.  Cardiovascular: Normal rate, regular rhythm and intact distal pulses.   Pulmonary/Chest: Effort normal and breath sounds normal. No respiratory distress. He exhibits no tenderness.  Musculoskeletal: Normal range of motion. He exhibits no edema and no tenderness.  Neurological: He is alert and oriented to person, place, and time.  Sensation and strength grossly intact.  Skin: Skin is warm and dry. No rash noted. He is not  diaphoretic. No erythema. No pallor.  There is a 2 cm superficial laceration along the lateral DIP region of the left 2nd finger tip. This is not bleeding and appears to be healing well. There is no surrounding erythema, no ttp, no warmth, no swelling, no fluctuance.  Psychiatric: He has a normal mood and affect. His behavior is normal. Judgment and thought content normal.     Lab Results  Component Value Date   WBC 5.8 05/14/2013   HGB 15.7 05/14/2013   HCT 45.5 05/14/2013   PLT 240.0 05/14/2013   GLUCOSE 103* 05/14/2013   CHOL 252* 05/14/2013   TRIG 147.0 05/14/2013   HDL 67.90 05/14/2013   LDLDIRECT 170.8 05/14/2013   ALT 21 05/14/2013   AST 23 05/14/2013   NA 136 05/14/2013   K 4.2 05/14/2013   CL 100 05/14/2013   CREATININE 1.0 05/14/2013   BUN 18 05/14/2013   CO2 28 05/14/2013   TSH 4.19 05/14/2013   PSA 0.55 05/14/2013   INR 0.87 08/22/2011        Assessment & Plan:  [name] was seen today for laceration.  Diagnoses and associated orders for this visit:  Need for prophylactic vaccination with combined diphtheria-tetanus-pertussis (DTP) vaccine - Tdap vaccine greater than or equal to 7yo IM  Laceration of finger, initial encounter Comments: Sensation and strength intact, healing appropriately, daily bandage changes, topical bacitracin, watchful waiting.    Spoke with Dr. Artist Wise regarding the pt, and I agree with him that the pt does not likely need empiric antibiotics for hx of prosthetic joint at this time. He suggests treatment with Keflex BID x7days if the pt develops fever or chills. The pt is amenable to this plan and will follow up as needed.  Return precautions provided, and patient handout on wound care.  Plan to follow up as needed, or for worsening or persistent symptoms despite treatment.  Patient Instructions  Daily dressing changes with Bacitracin ointment and covered with a plain bandaid. You can wash with sterile saline and peroxide for a few days, and  then can switch to cleaning with soap and water.   Try to keep the bandaid dry. If it gets wet, change the bandage.  If emergency symptoms discussed during visit developed, seek medical attention immediately.  Followup as needed, or for worsening or persistent symptoms despite treatment.

## 2014-03-29 ENCOUNTER — Encounter: Payer: Self-pay | Admitting: Internal Medicine

## 2014-07-20 ENCOUNTER — Telehealth: Payer: Self-pay | Admitting: Family Medicine

## 2014-07-20 MED ORDER — LEVOTHYROXINE SODIUM 25 MCG PO TABS: 25.0000 ug | ORAL_TABLET | Freq: Every day | ORAL | Status: AC

## 2014-07-20 MED ORDER — LOSARTAN POTASSIUM-HCTZ 100-12.5 MG PO TABS: 1.0000 | ORAL_TABLET | Freq: Every day | ORAL | Status: AC

## 2014-07-20 NOTE — Telephone Encounter (Signed)
Pt called to ask if Dr Clent RidgesFry will rx him the following med. [place] in [place] and  said he has a new doctor but can not see him until 08/11/14   levothyroxine (SYNTHROID, LEVOTHROID) 25 MCG tablet(Expired), losartan-hydrochlorothiazide (HYZAAR) 100-12.5 MG per tablet(Expired)    Pharmacy ; [id number]

## 2014-07-20 NOTE — Telephone Encounter (Signed)
Ok per Dr Clent RidgesFry to do 30 day supply.  [facility]

## 2018-12-04 ENCOUNTER — Encounter: Payer: Self-pay | Admitting: Gastroenterology
